# Patient Record
Sex: Male | Born: 1960
Health system: Southern US, Community
[De-identification: ages and names within clinical notes are randomized; demographics above are authoritative.]

## PROBLEM LIST (undated history)

## (undated) DIAGNOSIS — E785 Hyperlipidemia, unspecified: Secondary | ICD-10-CM

## (undated) DIAGNOSIS — G629 Polyneuropathy, unspecified: Secondary | ICD-10-CM

## (undated) DIAGNOSIS — R7309 Other abnormal glucose: Secondary | ICD-10-CM

## (undated) DIAGNOSIS — I10 Essential (primary) hypertension: Secondary | ICD-10-CM

## (undated) DIAGNOSIS — K635 Polyp of colon: Secondary | ICD-10-CM

## (undated) DIAGNOSIS — R030 Elevated blood-pressure reading, without diagnosis of hypertension: Secondary | ICD-10-CM

## (undated) DIAGNOSIS — R7989 Other specified abnormal findings of blood chemistry: Secondary | ICD-10-CM

## (undated) DIAGNOSIS — R0989 Other specified symptoms and signs involving the circulatory and respiratory systems: Secondary | ICD-10-CM

## (undated) DIAGNOSIS — N200 Calculus of kidney: Secondary | ICD-10-CM

## (undated) DIAGNOSIS — H269 Unspecified cataract: Secondary | ICD-10-CM

## (undated) DIAGNOSIS — L309 Dermatitis, unspecified: Secondary | ICD-10-CM

## (undated) DIAGNOSIS — R079 Chest pain, unspecified: Secondary | ICD-10-CM

## (undated) DIAGNOSIS — E119 Type 2 diabetes mellitus without complications: Secondary | ICD-10-CM

## (undated) DIAGNOSIS — U071 COVID-19: Secondary | ICD-10-CM

## (undated) DIAGNOSIS — G608 Other hereditary and idiopathic neuropathies: Secondary | ICD-10-CM

## (undated) DIAGNOSIS — K219 Gastro-esophageal reflux disease without esophagitis: Secondary | ICD-10-CM

## (undated) DIAGNOSIS — R609 Edema, unspecified: Secondary | ICD-10-CM

## (undated) DIAGNOSIS — R7301 Impaired fasting glucose: Secondary | ICD-10-CM

## (undated) HISTORY — DX: Polyneuropathy, unspecified: G62.9

## (undated) HISTORY — DX: Gastro-esophageal reflux disease without esophagitis: K21.9

## (undated) HISTORY — DX: Hyperlipidemia, unspecified: E78.5

## (undated) HISTORY — DX: Unspecified cataract: H26.9

## (undated) HISTORY — DX: Other specified symptoms and signs involving the circulatory and respiratory systems: R09.89

## (undated) HISTORY — DX: Type 2 diabetes mellitus without complications: E11.9

## (undated) HISTORY — DX: Other hereditary and idiopathic neuropathies: G60.8

## (undated) HISTORY — DX: Calculus of kidney: N20.0

## (undated) HISTORY — PX: KIDNEY STONE SURGERY: SHX686

## (undated) HISTORY — DX: Other abnormal glucose: R73.09

## (undated) HISTORY — DX: Elevated blood-pressure reading, without diagnosis of hypertension: R03.0

## (undated) HISTORY — DX: Edema, unspecified: R60.9

## (undated) HISTORY — DX: Other specified abnormal findings of blood chemistry: R79.89

## (undated) HISTORY — DX: Dermatitis, unspecified: L30.9

## (undated) HISTORY — DX: Impaired fasting glucose: R73.01

## (undated) HISTORY — DX: Polyp of colon: K63.5

## (undated) HISTORY — DX: COVID-19: U07.1

## (undated) HISTORY — DX: Chest pain, unspecified: R07.9

---

## 1996-01-12 DIAGNOSIS — N200 Calculus of kidney: Secondary | ICD-10-CM

## 1996-01-12 HISTORY — DX: Calculus of kidney: N20.0

## 2004-03-30 ENCOUNTER — Encounter: Admission: RE | Admit: 2004-03-30 | Discharge: 2004-03-30 | Payer: Self-pay | Admitting: Family Medicine

## 2006-01-11 DIAGNOSIS — G608 Other hereditary and idiopathic neuropathies: Secondary | ICD-10-CM

## 2006-01-11 HISTORY — DX: Other hereditary and idiopathic neuropathies: G60.8

## 2006-12-03 IMAGING — CT CT NECK W/O CM
2 series · 10 of 14 positions shown, 12 images · non-contrast
Comparison: none

[Series 2: without · axial · non-contrast · 0.57mm/px · z∈[+250,+400]mm · 3 of 61 slices shown]
[im 16/61  bone]
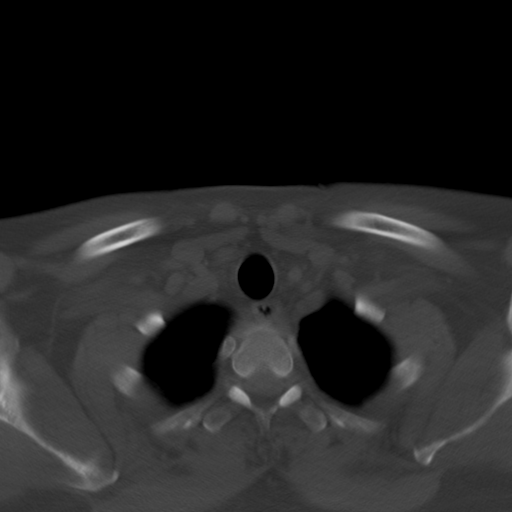
[im 31/61  bone]
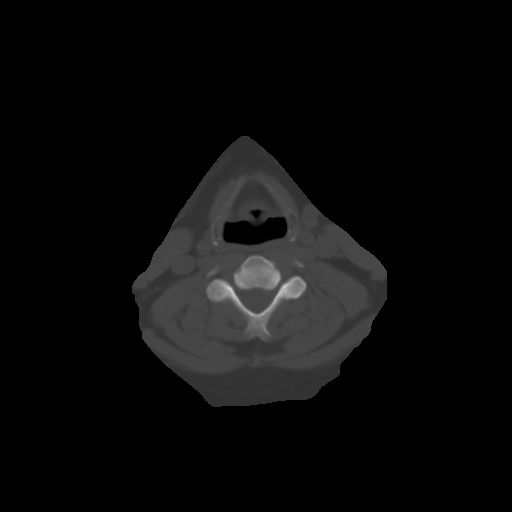
[im 46/61  bone]
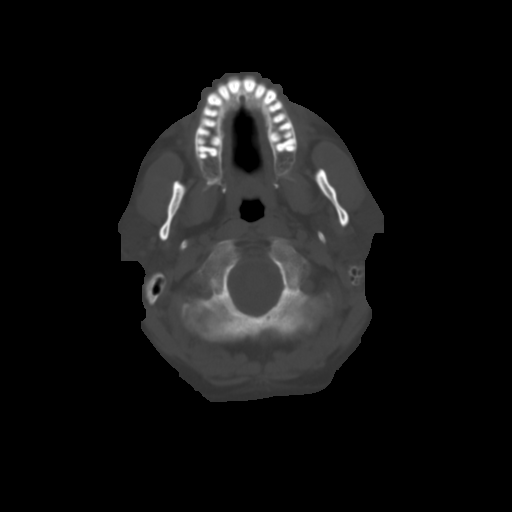

[Series 3: with contrast · axial · 0.58mm/px · z∈[+210,+435]mm · 7 of 101 slices shown, 9 images]
[im 13/101  soft-tissue]
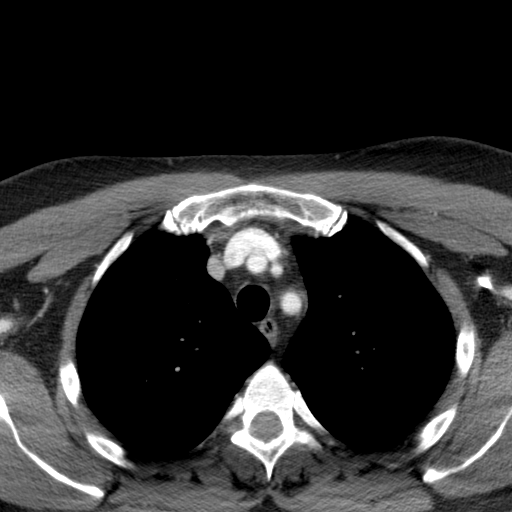
[im 13/101  bone]
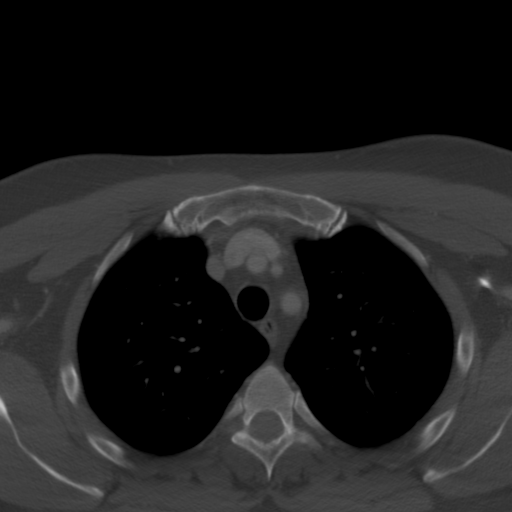
[im 26/101  bone]
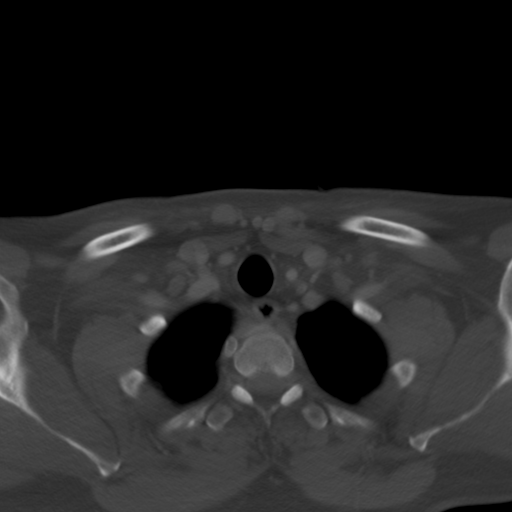
[im 38/101  bone]
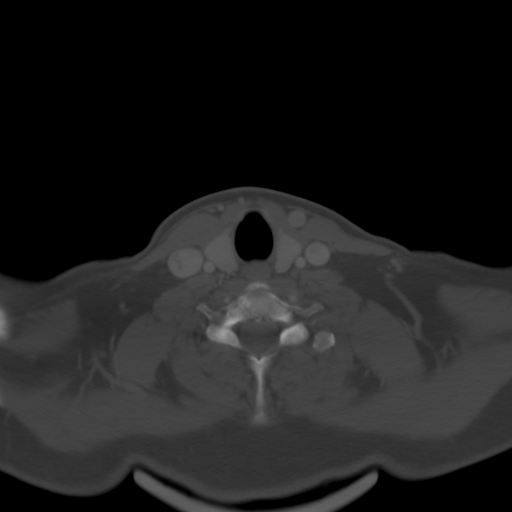
[im 51/101  bone]
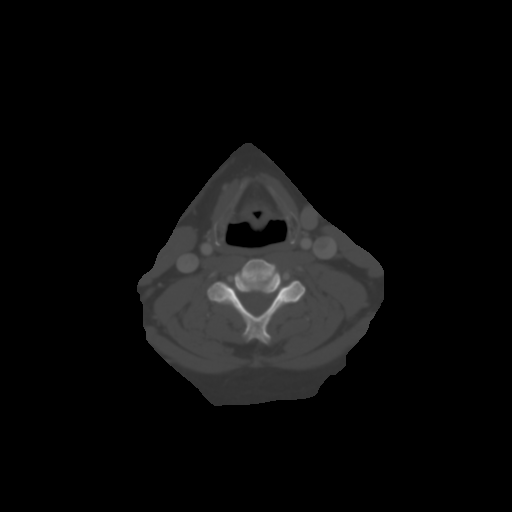
[im 63/101  soft-tissue]
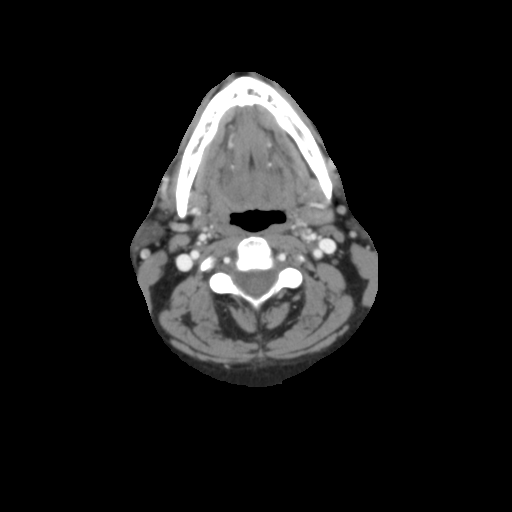
[im 63/101  bone]
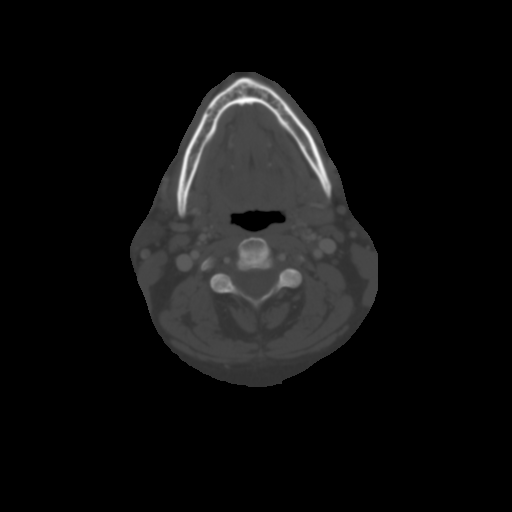
[im 76/101  bone]
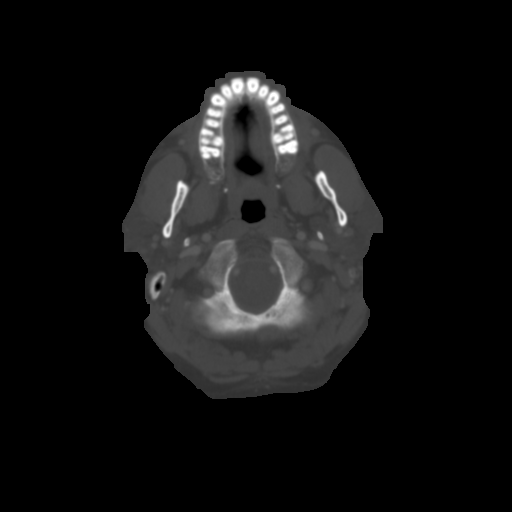
[im 88/101  bone]
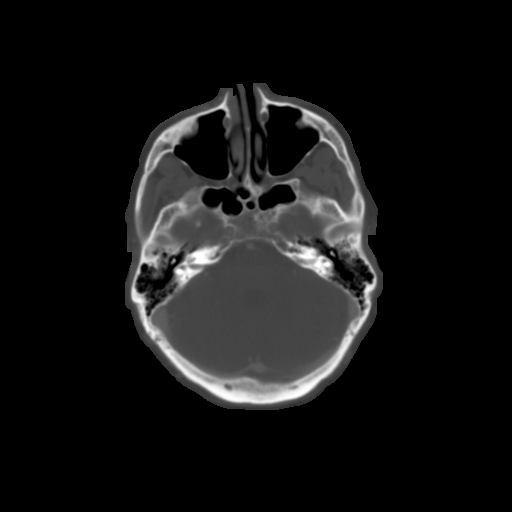

[10 of 14 positions shown; findings below may reference images not displayed]

Canned report from images found in remote index.

Refer to host system for actual result text.

## 2013-02-21 ENCOUNTER — Ambulatory Visit (HOSPITAL_COMMUNITY): Payer: BC Managed Care – PPO | Attending: Cardiology

## 2013-02-21 ENCOUNTER — Encounter (INDEPENDENT_AMBULATORY_CARE_PROVIDER_SITE_OTHER): Payer: Self-pay

## 2013-02-21 ENCOUNTER — Encounter: Payer: Self-pay | Admitting: Cardiology

## 2013-02-21 ENCOUNTER — Ambulatory Visit (INDEPENDENT_AMBULATORY_CARE_PROVIDER_SITE_OTHER): Payer: BC Managed Care – PPO | Admitting: Cardiology

## 2013-02-21 VITALS — BP 138/76 | HR 77 | Ht 75.0 in | Wt 271.0 lb

## 2013-02-21 DIAGNOSIS — G609 Hereditary and idiopathic neuropathy, unspecified: Secondary | ICD-10-CM

## 2013-02-21 DIAGNOSIS — R209 Unspecified disturbances of skin sensation: Secondary | ICD-10-CM | POA: Insufficient documentation

## 2013-02-21 DIAGNOSIS — G629 Polyneuropathy, unspecified: Secondary | ICD-10-CM

## 2013-02-21 DIAGNOSIS — E1342 Other specified diabetes mellitus with diabetic polyneuropathy: Secondary | ICD-10-CM

## 2013-02-21 DIAGNOSIS — E1349 Other specified diabetes mellitus with other diabetic neurological complication: Secondary | ICD-10-CM

## 2013-02-21 DIAGNOSIS — E785 Hyperlipidemia, unspecified: Secondary | ICD-10-CM

## 2013-02-21 DIAGNOSIS — I739 Peripheral vascular disease, unspecified: Secondary | ICD-10-CM

## 2013-02-21 DIAGNOSIS — R609 Edema, unspecified: Secondary | ICD-10-CM

## 2013-02-21 DIAGNOSIS — R7309 Other abnormal glucose: Secondary | ICD-10-CM | POA: Insufficient documentation

## 2013-02-21 DIAGNOSIS — R600 Localized edema: Secondary | ICD-10-CM

## 2013-02-21 DIAGNOSIS — I10 Essential (primary) hypertension: Secondary | ICD-10-CM | POA: Insufficient documentation

## 2013-02-21 DIAGNOSIS — E1142 Type 2 diabetes mellitus with diabetic polyneuropathy: Secondary | ICD-10-CM

## 2013-02-21 DIAGNOSIS — R0789 Other chest pain: Secondary | ICD-10-CM

## 2013-02-21 DIAGNOSIS — R0989 Other specified symptoms and signs involving the circulatory and respiratory systems: Secondary | ICD-10-CM | POA: Insufficient documentation

## 2013-02-21 NOTE — Patient Instructions (Signed)
Your physician recommends that you continue on your current medications as directed. Please refer to the Current Medication list given to you today.  Your physician has requested that you have an echocardiogram. Echocardiography is a painless test that uses sound waves to create images of your heart. It provides your doctor with information about the size and shape of your heart and how well your heart's chambers and valves are working. This procedure takes approximately one hour. There are no restrictions for this procedure.  Your physician has requested that you have en exercise stress myoview. For further information please visit [email or web address]. Please follow instruction sheet, as given.  Your physician has requested that you have a lower extremity arterial duplex. This test is an ultrasound of the arteries in the legs or arms. It looks at arterial blood flow in the legs and arms. Allow one hour for Lower and Upper Arterial scans. There are no restrictions or special instructions  Your physician recommends that you follow-up as needed.

## 2013-02-21 NOTE — Progress Notes (Signed)
[place]. [address], [address] [place], [place]  [address] Phone: [phone] Fax:  [phone]  Date:  02/21/2013   ID:  Romen, Stephen Dorsey, MRN [id number]  PCP:  Gerrit Heck, MD   History of Present Illness: Stephen Dorsey is a 53 y.o. male here for the evaluation of decreased pedal pulses and possible peripheral vascular disease.  Been experiencing bilateral lower extremity edema to mid calf with apparent decreased pulses on the right. A few years ago dx with peripheral neuropathy. Spots on feet. Worried about poor circulation. Some chest soreness epigastric after eating, spicy. Some soreness in muscles in chest wall. Surface.  Lasix 20 mg was started as needed for edema but he has not taken. Fasting glucose was 106, hemoglobin 15.6, hemoglobin A1c 5.7, potassium 4.2, creatinine 0.93. His LDL cholesterol was 120. TSH 3.92. Pre diabetic, on metformin.    Wt Readings from Last 3 Encounters:  02/21/13 271 lb (122.925 kg)     Past Medical History  Diagnosis Date  . Peripheral neuropathy   . Elevated fasting glucose   . Kidney stone 1998  . Idiopathic small fiber sensory neuropathy 2008    Dx in 2008. Getting worse, has trouble feeling his feet. No Sx's in arms.  . Colon polyps     By colonoscopy in 2009   . Chest pain     Occasional CP  . Decreased pedal pulses   . Edema   . Elevated blood pressure reading without diagnosis of hypertension     History reviewed. No pertinent past surgical history.  Current Outpatient Prescriptions  Medication Sig Dispense Refill  . metFORMIN (GLUCOPHAGE) 500 MG tablet Take 500 mg by mouth 2 (two) times daily with a meal.      . Multiple Vitamin (MULTIVITAMIN) tablet Take as directed      . Probiotic Product (PROBIOTIC DAILY PO) Take 1 capsule by mouth as needed.      . Psyllium (METAMUCIL) 30.9 % POWD Take 1 packet by mouth daily.       No current facility-administered medications for this visit.    Allergies:    No Known Allergies  Social History:  The patient  reports that he has never smoked. He does not have any smokeless tobacco history on file. He reports that he drinks about 1.2 ounces of alcohol per week. He reports that he does not use illicit drugs. travels frequently with company  History reviewed. No pertinent family history. No early CAD. Mother thyroid. Father lung CA.   ROS:  Please see the history of present illness.   Denies any syncope, bleeding, orthopnea, PND   All other systems reviewed and negative.   PHYSICAL EXAM: VS:  BP 138/76  Pulse 77  Ht 6\' 3"  (1.905 m)  Wt 271 lb (122.925 kg)  BMI 33.87 kg/m2 Well nourished, well developed, in no acute distress HEENT: normal, [place]/AT, EOMI Neck: no JVD, normal carotid upstroke, no bruit Cardiac:  normal S1, S2; RRR; no murmurNo obvious JVD Lungs:  clear to auscultation bilaterally, no wheezing, rhonchi or rales Abd: soft, nontender, no hepatomegaly, no bruits Ext: 2+  edema, diminished distal pulses, difficult to palpate, decreased sensation in feet bilaterally. Skin: warm and dry, shiny appearance to feet bilaterally GU: deferred Neuro: no focal abnormalities noted, AAO x 3  EKG:  Sinus rhythm rate 77 with no other abnormalities     ASSESSMENT AND PLAN:  1. Diminished pulses, pedal, posterior tibial-concerned about peripheral  vascular disease. I will check lower extremity ultrasound/ABI. Has the appearance of decreased pulses on feet bilaterally. He is seeing neurology on Monday. Previously he states that he has had vitamin B12 evaluated. I'm concerned about the extent of his neuropathy. 2. Atypical chest pain-likely GERD/musculoskeletal related however given his diabetes and possible peripheral vascular disease, I will check a nuclear stress test. 3. Hyperlipidemia-LDL currently 120. My suggestion would be to start statin therapy especially with his diabetes. It would be nice to see his LDL goal less than 100, less than 70 would  be outstanding. 4. Edema-could be chronic venous insufficiency, could be secondary to elevated central venous pressures for right-sided heart issue. I will check echocardiogram. Encouraged him to utilize Lasix, compression stockings. 5. Diabetes-hemoglobin A1c 5.7 on metformin, seems to be under good control however he does have significant peripheral neuropathy which may be unrelated. Questionable peripheral vascular disease. 6. We will followup with testing  Signed, Candee Furbish, MD [facility]  02/21/2013 10:51 AM

## 2013-02-26 ENCOUNTER — Encounter: Payer: Self-pay | Admitting: Diagnostic Neuroimaging

## 2013-02-26 ENCOUNTER — Ambulatory Visit (INDEPENDENT_AMBULATORY_CARE_PROVIDER_SITE_OTHER): Payer: BC Managed Care – PPO | Admitting: Diagnostic Neuroimaging

## 2013-02-26 VITALS — BP 196/102 | HR 71 | Temp 98.1°F | Ht 74.0 in | Wt 270.0 lb

## 2013-02-26 DIAGNOSIS — G609 Hereditary and idiopathic neuropathy, unspecified: Secondary | ICD-10-CM

## 2013-02-26 NOTE — Progress Notes (Signed)
[facility]  PATIENT: Stephen Dorsey DOB: 14-Sep-1960  REFERRING CLINICIAN: Drema Dallas  HISTORY FROM: patient REASON FOR VISIT: new consult   HISTORICAL  CHIEF COMPLAINT:  Chief Complaint  Patient presents with  . Peripheral Neuropathy    numbness in toes, bilateral    HISTORY OF PRESENT ILLNESS:   53 year old right-handed male with hypertension, elevated blood sugars, here for evaluation of neuropathy.  2006 patient developed onset of numbness, burning sensation in his feet. Symptoms mainly on the top of his feet and his toes. He also developing numbness and painful sensation. Over time this gradually progressed up to his knees. Currently he has no pain. Patient had EMG nerve conduction study in 2008 which was normal. Patient was diagnosed with possible idiopathic small fiber neuropathy. Patient's father had similar symptoms.  Patient now resides for reevaluation to see if there is any specific cause. No symptoms in his hands or fingers. He is having some balance issues.  Patient also developed edema in his lower extremities recently. He had decreased pedal pulses, was then referred to cardiology for evaluation of peripheral vascular disease. Lower extremity ankle-brachial index and ultrasound testing is normal.  REVIEW OF SYSTEMS: Full 14 system review of systems performed and notable only for decreased sleep numbness snoring swelling in legs itching.  ALLERGIES: No Known Allergies  HOME MEDICATIONS: Outpatient Prescriptions Prior to Visit  Medication Sig Dispense Refill  . metFORMIN (GLUCOPHAGE) 500 MG tablet Take 500 mg by mouth 2 (two) times daily with a meal.      . Multiple Vitamin (MULTIVITAMIN) tablet Take as directed      . Probiotic Product (PROBIOTIC DAILY PO) Take 1 capsule by mouth as needed.      . Psyllium (METAMUCIL) 30.9 % POWD Take 1 packet by mouth daily.       No facility-administered medications prior to visit.    PAST MEDICAL  HISTORY: Past Medical History  Diagnosis Date  . Peripheral neuropathy   . Elevated fasting glucose   . Kidney stone 1998  . Idiopathic small fiber sensory neuropathy 2008    Dx in 2008. Getting worse, has trouble feeling his feet. No Sx's in arms.  . Colon polyps     By colonoscopy in 2009   . Chest pain     Occasional CP  . Decreased pedal pulses   . Edema   . Elevated blood pressure reading without diagnosis of hypertension   . Dermatitis   . Elevated glucose     PAST SURGICAL HISTORY: Past Surgical History  Procedure Laterality Date  . Kidney stone surgery      FAMILY HISTORY: Family History  Problem Relation Age of Onset  . Thyroid disease Mother     thyroid problems  . Lung cancer Father   . Diabetes Maternal Grandfather     SOCIAL HISTORY:  History   Social History  . Marital Status: Married    Spouse Name: [name]    Number of Children: 2  . Years of Education: College   Occupational History  .      [employer]   Social History Main Topics  . Smoking status: Never Smoker   . Smokeless tobacco: Never Used  . Alcohol Use: 1.2 oz/week    2 Cans of beer per week     Comment: 2+ beers per week  . Drug Use: No  . Sexual Activity: Not on file   Other Topics Concern  . Not on file   Social History  Narrative   Patient lives at home with family.   Caffeine Use: 200z daily     PHYSICAL EXAM  Filed Vitals:   02/26/13 0901  BP: 196/102  Pulse: 71  Temp: 98.1 F (36.7 C)  TempSrc: Oral  Height: 6\' 2"  (1.88 m)  Weight: 270 lb (122.471 kg)    Not recorded    Body mass index is 34.65 kg/(m^2).  GENERAL EXAM: Patient is in no distress; well developed, nourished and groomed; neck is supple  CARDIOVASCULAR: Regular rate and rhythm, no murmurs, no carotid bruits; DECR DORSALIS PEDIS PULSES.  NEUROLOGIC: MENTAL STATUS: awake, alert, oriented to person, place and time, recent and remote memory intact, normal attention and concentration, language  fluent, comprehension intact, naming intact, fund of knowledge appropriate CRANIAL NERVE: no papilledema on fundoscopic exam, pupils equal and reactive to light, visual fields full to confrontation, extraocular muscles intact, no nystagmus, facial sensation and strength symmetric, hearing intact, palate elevates symmetrically, uvula midline, shoulder shrug symmetric, tongue midline. MOTOR: normal bulk and tone, full strength in the BUE, BLE SENSORY: normal and symmetric to proprioception; DECR PP, TEMP, VIB IN TOES, IN GRADIENT UP TO KNEES. COORDINATION: finger-nose-finger, fine finger movements normal REFLEXES: deep tendon reflexes present and symmetric; KNEES 3, RIGHT ANKLE 2, LEFT ANKLE 1; DOWN GOING TOES. GAIT/STATION: narrow based gait; able to walk on toes, heels and tandem; romberg is negative    DIAGNOSTIC DATA (LABS, IMAGING, TESTING) - I reviewed patient records, labs, notes, testing and imaging myself where available.  No results found for this basename: WBC, HGB, HCT, MCV, PLT   No results found for this basename: na, k, cl, co2, glucose, bun, creatinine, calcium, prot, albumin, ast, alt, alkphos, bilitot, gfrnonaa, gfraa   No results found for this basename: CHOL, HDL, LDLCALC, LDLDIRECT, TRIG, CHOLHDL   No results found for this basename: HGBA1C   No results found for this basename: VITAMINB12   No results found for this basename: TSH    12/27/06 EMG/NCS - normal  02/22/13 BLE arterial u/s and ABI - normal  Labs - B12 424, TSH 3.92, A1c 5.7, fasting glucose 106  Lipids - CHOL 178, TRIG 80, LDL 120, HDL 42  ASSESSMENT AND PLAN  53 y.o. year old male here with diopathic small fiber neuropathy since 2006. Positive family history of neuropathy in father.  PLAN: - repeat EMG/NCS - consider additional lab screening for neuropathy causes - continue follow up of HTN, lipids and glucose per PCP   Orders Placed This Encounter  Procedures  . NCV with  EMG(electromyography)   Return for emg.    Penni Bombard, MD 04/13/4740, 59:56 AM Certified in Neurology, Neurophysiology and Neuroimaging  [facility] [address] [place],  [address] [phone]

## 2013-02-28 ENCOUNTER — Ambulatory Visit (INDEPENDENT_AMBULATORY_CARE_PROVIDER_SITE_OTHER): Payer: BC Managed Care – PPO | Admitting: Diagnostic Neuroimaging

## 2013-02-28 ENCOUNTER — Telehealth (HOSPITAL_COMMUNITY): Payer: Self-pay | Admitting: *Deleted

## 2013-02-28 ENCOUNTER — Encounter (INDEPENDENT_AMBULATORY_CARE_PROVIDER_SITE_OTHER): Payer: Self-pay

## 2013-02-28 DIAGNOSIS — G609 Hereditary and idiopathic neuropathy, unspecified: Secondary | ICD-10-CM

## 2013-02-28 DIAGNOSIS — Z0289 Encounter for other administrative examinations: Secondary | ICD-10-CM

## 2013-02-28 NOTE — Procedures (Signed)
   [facility]  NCS (NERVE CONDUCTION STUDY) WITH EMG (ELECTROMYOGRAPHY) REPORT   STUDY DATE: 02/28/13 PATIENT NAME: Stephen Dorsey DOB: 06/13/1960 MRN: [id number]  ORDERING CLINICIAN: Andrey Spearman, MD   TECHNOLOGIST: Laretta Alstrom ELECTROMYOGRAPHER: Earlean Polka. Penumalli, MD  CLINICAL INFORMATION: 53 year old male with numbness.  FINDINGS: NERVE CONDUCTION STUDY: Right median motor response has normal distal latency, amplitude, conduction velocity and F-wave latency. Left peroneal motor response has normal distal latency, amplitude, conduction velocity and F-wave latency. Right peroneal motor response has normal distal latency, decreased amplitude, normal conduction velocity and prolonged F-wave latency. Right tibial motor response has prolonged distal latency, normal amplitude, normal conduction velocity and prolonged F-wave latency. Left tibial motor response has prolonged distal latency, decreased amplitude, normal conduction velocity and prolonged F-wave latency. Bilateral H reflex responses could not be obtained. Right median sensory response is normal. Bilateral sural, peroneal, medial and lateral plantar sensory responses could not be obtained.   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 Concomitant lumbar polyradiculopathy not totally excluded, based on F-wave and H-reflex abnormalities.   INTERPRETING PHYSICIAN:  Penni Bombard, MD Certified in Neurology, Neurophysiology and Neuroimaging  [facility] [address] [place], [place] [address] [phone]

## 2013-03-01 ENCOUNTER — Ambulatory Visit (HOSPITAL_COMMUNITY): Payer: BC Managed Care – PPO

## 2013-03-02 ENCOUNTER — Encounter (HOSPITAL_COMMUNITY): Payer: BC Managed Care – PPO

## 2013-03-07 ENCOUNTER — Encounter (HOSPITAL_COMMUNITY): Payer: BC Managed Care – PPO

## 2013-03-07 ENCOUNTER — Telehealth (HOSPITAL_COMMUNITY): Payer: Self-pay | Admitting: *Deleted

## 2013-03-08 ENCOUNTER — Ambulatory Visit (HOSPITAL_COMMUNITY): Payer: BC Managed Care – PPO

## 2013-03-08 LAB — LYME, TOTAL AB TEST/REFLEX

## 2013-03-08 LAB — NEUROPATHY PANEL
A/G Ratio: 1.9 (ref 0.7–2.0)
ALPHA 1: 0.1 g/dL (ref 0.1–0.4)
ALPHA 2: 0.5 g/dL (ref 0.4–1.2)
Albumin ELP: 4.3 g/dL (ref 3.2–5.6)
Angio Convert Enzyme: 32 U/L (ref 14–82)
Anti Nuclear Antibody(ANA): NEGATIVE
BETA: 0.8 g/dL (ref 0.6–1.3)
GAMMA GLOBULIN: 0.8 g/dL (ref 0.5–1.6)
GLOBULIN, TOTAL: 2.3 g/dL (ref 2.0–4.5)
SED RATE: 5 mm/h (ref 0–30)
TSH: 4.27 u[IU]/mL (ref 0.450–4.500)
Total Protein: 6.6 g/dL (ref 6.0–8.5)
Vit D, 25-Hydroxy: 25.5 ng/mL — ABNORMAL LOW (ref 30.0–100.0)
Vitamin B-12: 692 pg/mL (ref 211–946)

## 2013-03-08 LAB — HIV ANTIBODY (ROUTINE TESTING W REFLEX): HIV-1/HIV-2 Ab: NONREACTIVE

## 2013-03-08 LAB — HEPATITIS B SURFACE ANTIGEN: HEP B S AG: NEGATIVE

## 2013-03-08 LAB — HEPATITIS B CORE ANTIBODY, TOTAL: Hep B Core Total Ab: NEGATIVE

## 2013-03-08 LAB — HEPATITIS C ANTIBODY: Hep C Virus Ab: <0.1 s/co ratio (ref 0.0–0.9)

## 2013-03-08 LAB — HEPATITIS B SURFACE ANTIBODY,QUALITATIVE: HEP B SURFACE AB, QUAL: NONREACTIVE

## 2013-03-20 ENCOUNTER — Ambulatory Visit (HOSPITAL_COMMUNITY)
Admission: RE | Admit: 2013-03-20 | Discharge: 2013-03-20 | Disposition: A | Discharge status: Home / Self Care | Payer: BC Managed Care – PPO | Source: Ambulatory Visit | Attending: Cardiovascular Disease | Admitting: Cardiovascular Disease

## 2013-03-20 DIAGNOSIS — I517 Cardiomegaly: Secondary | ICD-10-CM

## 2013-03-20 DIAGNOSIS — R0789 Other chest pain: Secondary | ICD-10-CM | POA: Insufficient documentation

## 2013-03-20 NOTE — Progress Notes (Signed)
2D Echo Performed 03/20/2013    Ambika Zettlemoyer, RCS  

## 2013-03-21 ENCOUNTER — Telehealth (HOSPITAL_COMMUNITY): Payer: Self-pay

## 2013-03-23 ENCOUNTER — Encounter (HOSPITAL_COMMUNITY): Payer: BC Managed Care – PPO

## 2013-04-12 ENCOUNTER — Telehealth (HOSPITAL_COMMUNITY): Payer: Self-pay

## 2013-04-18 ENCOUNTER — Ambulatory Visit (HOSPITAL_COMMUNITY)
Admission: RE | Admit: 2013-04-18 | Discharge: 2013-04-18 | Disposition: A | Discharge status: Home / Self Care | Payer: BC Managed Care – PPO | Source: Ambulatory Visit | Attending: Internal Medicine | Admitting: Internal Medicine

## 2013-04-18 DIAGNOSIS — E663 Overweight: Secondary | ICD-10-CM | POA: Insufficient documentation

## 2013-04-18 DIAGNOSIS — I739 Peripheral vascular disease, unspecified: Secondary | ICD-10-CM | POA: Insufficient documentation

## 2013-04-18 DIAGNOSIS — I10 Essential (primary) hypertension: Secondary | ICD-10-CM | POA: Insufficient documentation

## 2013-04-18 DIAGNOSIS — I959 Hypotension, unspecified: Secondary | ICD-10-CM | POA: Insufficient documentation

## 2013-04-18 DIAGNOSIS — R079 Chest pain, unspecified: Secondary | ICD-10-CM | POA: Insufficient documentation

## 2013-04-18 DIAGNOSIS — I999 Unspecified disorder of circulatory system: Secondary | ICD-10-CM | POA: Insufficient documentation

## 2013-04-18 DIAGNOSIS — R002 Palpitations: Secondary | ICD-10-CM | POA: Insufficient documentation

## 2013-04-18 DIAGNOSIS — R0789 Other chest pain: Secondary | ICD-10-CM

## 2013-04-18 DIAGNOSIS — R0609 Other forms of dyspnea: Secondary | ICD-10-CM | POA: Insufficient documentation

## 2013-04-18 DIAGNOSIS — R0989 Other specified symptoms and signs involving the circulatory and respiratory systems: Secondary | ICD-10-CM | POA: Insufficient documentation

## 2013-04-18 DIAGNOSIS — R5381 Other malaise: Secondary | ICD-10-CM | POA: Insufficient documentation

## 2013-04-18 DIAGNOSIS — R5383 Other fatigue: Secondary | ICD-10-CM

## 2013-04-18 DIAGNOSIS — I4949 Other premature depolarization: Secondary | ICD-10-CM | POA: Insufficient documentation

## 2013-04-18 MED ORDER — TECHNETIUM TC 99M SESTAMIBI GENERIC - CARDIOLITE
30.8000 | Freq: Once | INTRAVENOUS | Status: AC | PRN
  Administered 2013-04-18: 30.8 via INTRAVENOUS

## 2013-04-18 MED ORDER — TECHNETIUM TC 99M SESTAMIBI GENERIC - CARDIOLITE
10.5000 | Freq: Once | INTRAVENOUS | Status: AC | PRN
  Administered 2013-04-18: 11 via INTRAVENOUS

## 2013-04-18 NOTE — Procedures (Addendum)
[facility] [address] [place] [address] [phone]  Cardiology Nuclear Med Study  Stephen Dorsey is a 53 y.o. male     MRN : [id number]     DOB: 01-30-1960  Procedure Date: 04/18/2013  Nuclear Med Background Indication for Stress Test:  Evaluation for Ischemia History:  No prior cardiac or respiratory history reported. No prior NUC study for comparrison. Cardiac Risk Factors: Hypertension, Overweight and PVD  Symptoms:  Chest Pain, DOE, Fatigue and Palpitations   Nuclear Pre-Procedure Caffeine/Decaff Intake:  7:00pm NPO After: 5:00am   IV Site: R Forearm  IV 0.9% NS with Angio Cath:  22g  Chest Size (in):  44"  IV Started by: Azucena Cecil, RN  Height: 6\' 3"  (1.905 m)  Cup Size: n/a  BMI:  Body mass index is 33.87 kg/(m^2). Weight:  271 lb (122.925 kg)   Tech Comments:  n/a    Nuclear Med Study 1 or 2 day study: 1 day  Stress Test Type:  Stress  Order Authorizing Provider:  Candee Furbish, MD   Resting Radionuclide: Technetium 58m Sestamibi  Resting Radionuclide Dose: 10.5 mCi   Stress Radionuclide:  Technetium 104m Sestamibi  Stress Radionuclide Dose: 30.8 mCi           Stress Protocol Rest HR: 74 Stress HR: 157  Rest BP: 165/105 Stress BP: 197/99  Exercise Time (min): 8:36 METS: 10.1   Predicted Max HR: 166 bpm % Max HR: 94.58 bpm Rate Pressure Product: 32656  Dose of Adenosine (mg):  n/a Dose of Lexiscan: n/a mg  Dose of Atropine (mg): n/a Dose of Dobutamine: n/a mcg/kg/min (at max HR)  Stress Test Technologist: Leane Para, CCT Nuclear Technologist: Imagene Riches, CNMT   Rest Procedure:  Myocardial perfusion imaging was performed at rest 45 minutes following the intravenous administration of Technetium 64m Sestamibi. Stress Procedure:  The patient performed treadmill exercise using a Bruce  Protocol for 8:36 minutes. The patient stopped due to SOB and Fatigue and denied any chest pain.  There were  no significant ST-T wave changes.  Technetium 53m Sestamibi was injected IV at peak exercise and myocardial perfusion imaging was performed after a brief delay.  Transient Ischemic Dilatation (Normal <1.22):  0.76 Lung/Heart Ratio (Normal <0.45):  0.30 QGS EDV:  107 ml QGS ESV:  40 ml LV Ejection Fraction: 63%  Rest ECG: NSR - Normal EKG  Stress ECG: No significant ST segment change suggestive of ischemia.  QPS Raw Data Images:  Normal; no motion artifact; normal heart/lung ratio. Stress Images:  There is decreased uptake in the inferior wall. Rest Images:  There is decreased uptake in the inferior wall. Subtraction (SDS):  Partially reversible basal inferoseptal defect  Impression Exercise Capacity:  Good exercise capacity. BP Response:  Hypotensive blood pressure response. Clinical Symptoms:  There is dyspnea. ECG Impression:  There are scattered PVCs. Comparison with Prior Nuclear Study: No previous nuclear study performed  Overall Impression:  Intermediate risk stress nuclear study with moderate-sized area of partially reversible inferoseptal ischemia (SDS 5, Extent 7%) There was a hypotensive response to exercise. PVC's were noted, but no diagnostic ST segment depression was appreciated.  LV Wall Motion:  EF 63%, basal inferoseptal hypokinesis.  Pixie Casino, MD, [facility] Board Certified in Nuclear Cardiology Attending Cardiologist [place]  Pixie Casino, MD  04/18/2013 12:55 PM

## 2013-04-26 ENCOUNTER — Encounter: Payer: Self-pay | Admitting: Cardiology

## 2013-05-03 ENCOUNTER — Encounter: Payer: Self-pay | Admitting: Cardiology

## 2013-05-09 NOTE — Telephone Encounter (Signed)
Follow up    Pt would like a reply to his questions on MyChart today, so that he can come in with some educated questions to his 4/30 visit please.  Thanks!

## 2013-05-10 ENCOUNTER — Encounter: Payer: Self-pay | Admitting: Cardiology

## 2013-05-10 ENCOUNTER — Ambulatory Visit (INDEPENDENT_AMBULATORY_CARE_PROVIDER_SITE_OTHER): Payer: BC Managed Care – PPO | Admitting: Cardiology

## 2013-05-10 VITALS — BP 148/86 | HR 78 | Ht 74.0 in | Wt 268.0 lb

## 2013-05-10 DIAGNOSIS — R079 Chest pain, unspecified: Secondary | ICD-10-CM

## 2013-05-10 DIAGNOSIS — E785 Hyperlipidemia, unspecified: Secondary | ICD-10-CM | POA: Insufficient documentation

## 2013-05-10 DIAGNOSIS — E1169 Type 2 diabetes mellitus with other specified complication: Secondary | ICD-10-CM | POA: Insufficient documentation

## 2013-05-10 DIAGNOSIS — R9439 Abnormal result of other cardiovascular function study: Secondary | ICD-10-CM | POA: Insufficient documentation

## 2013-05-10 DIAGNOSIS — R609 Edema, unspecified: Secondary | ICD-10-CM | POA: Insufficient documentation

## 2013-05-10 DIAGNOSIS — E119 Type 2 diabetes mellitus without complications: Secondary | ICD-10-CM | POA: Insufficient documentation

## 2013-05-10 NOTE — Patient Instructions (Signed)
Your physician recommends that you continue on your current medications as directed. Please refer to the Current Medication list given to you today.  Your physician wants you to follow-up in: 1 year with Dr. Skains. You will receive a reminder letter in the mail two months in advance. If you don't receive a letter, please call our office to schedule the follow-up appointment.  

## 2013-05-10 NOTE — Progress Notes (Signed)
Stephen Dorsey. [address] [place], [place]  [address] Phone: [phone] Fax:  [phone]  Date:  05/10/2013   ID:  Stephen Dorsey, Stephen Dorsey [birth year], MRN [id number]  PCP:  Stephen Heck, MD   History of Present Illness: Stephen Dorsey is a 53 y.o. male here for the follow up of decreased pedal pulses and possible peripheral vascular disease.  Been experiencing bilateral lower extremity edema to mid calf with apparent decreased pulses on the right. A few years ago dx with peripheral neuropathy. Spots on feet. Worried about poor circulation. Some chest soreness epigastric after eating, spicy. Some soreness in muscles in chest wall. Surface.  Lasix 20 mg was started as needed for edema but he has not taken. Fasting glucose was 106, hemoglobin 15.6, hemoglobin A1c 5.7, potassium 4.2, creatinine 0.93. His LDL cholesterol was 120. TSH 3.92. Pre diabetic, on metformin.   ABI 02/22/13: Normal  ECHO 03/20/13: - Left ventricle: The cavity size was mildly dilated. Wall thickness was normal. Systolic function was normal. The estimated ejection fraction was in the range of 55% to 60%. Wall motion was normal; there were no regional wall motion abnormalities. Doppler parameters are consistent with abnormal left ventricular relaxation (grade 1 diastolic dysfunction). - Aortic valve: Trivial regurgitation. - Aortic root: The aortic root was mildly dilated. 41mm - Left atrium: The atrium was mildly dilated.  NUC STRESS: 04/18/13: Overall Impression: Intermediate risk stress nuclear study with  moderate-sized area of partially reversible inferoseptal ischemia (SDS 5, Extent 7%) There was a hypotensive response to exercise.  PVC's were noted, but no diagnostic ST segment depression was  appreciated.  LV Wall Motion: EF 63%, basal inferoseptal hypokinesis.   Encouraged to proceed with heart catheterization based on findings. Please see below.   Wt Readings from Last 3 Encounters:    05/10/13 268 lb (121.564 kg)  04/18/13 271 lb (122.925 kg)  02/26/13 270 lb (122.471 kg)     Past Medical History  Diagnosis Date  . Peripheral neuropathy   . Elevated fasting glucose   . Kidney stone 1998  . Idiopathic small fiber sensory neuropathy 2008    Dx in 2008. Getting worse, has trouble feeling his feet. No Sx's in arms.  . Colon polyps     By colonoscopy in 2009   . Chest pain     Occasional CP  . Decreased pedal pulses   . Edema   . Elevated blood pressure reading without diagnosis of hypertension   . Dermatitis   . Elevated glucose     Past Surgical History  Procedure Laterality Date  . Kidney stone surgery      No current outpatient prescriptions on file.   No current facility-administered medications for this visit.    Allergies:   No Known Allergies  Social History:  The patient  reports that he has never smoked. He has never used smokeless tobacco. He reports that he drinks about 1.2 ounces of alcohol per week. He reports that he does not use illicit drugs. travels frequently with company  Family History  Problem Relation Age of Onset  . Thyroid disease Mother     thyroid problems  . Lung cancer Father   . Diabetes Maternal Grandfather    No early CAD. Mother thyroid. Father lung CA.   ROS:  Please see the history of present illness.   Denies any syncope, bleeding, orthopnea, PND   All other systems reviewed and negative.   PHYSICAL EXAM:  VS:  BP 148/86  Pulse 78  Ht 6\' 2"  (1.88 m)  Wt 268 lb (121.564 kg)  BMI 34.39 kg/m2 Well nourished, well developed, in no acute distress(Parts of physical exam were performed during original consultative visit) HEENT: normal, [place]/AT, EOMI Neck: no JVD, normal carotid upstroke, no bruit Cardiac:  normal S1, S2; RRR; no murmurNo obvious JVD Lungs:  clear to auscultation bilaterally, no wheezing, rhonchi or rales Abd: soft, nontender, no hepatomegaly, no bruits Ext: 2+  edema, diminished distal pulses,  difficult to palpate, decreased sensation in feet bilaterally. Skin: warm and dry, shiny appearance to feet bilaterally GU: deferred Neuro: no focal abnormalities noted, AAO x 3  EKG:  Sinus rhythm rate 77 with no other abnormalities     ASSESSMENT AND PLAN:  1. Abnormal nuclear stress test-intermediate risk result with hypotension during exercise, possible inferoseptal wall ischemia in place. Recommend cardiac catheterization. He will continue to monitor symptoms. GERD like symptoms. If worsening occurs please contact me. Expressed concerns over study findings. He understands that this could result in possible myocardial infarction. Had several questions about nuclear stress test, and we went over in detail the physiology behind this study. Ultimately, with decreasing blood pressure reported and defect noted, I recommend proceeding with angiogram. For now, continue with lifestyle modifications. Dr. Drema Dorsey continuing to monitor him.  2. Diminished pulses, pedal, posterior tibial-overall normal ABIs. Has the appearance of decreased pulses on feet bilaterally. He is seeing neurology on Monday. Previously he states that he has had vitamin B12 evaluated. I'm concerned about the extent of his neuropathy. 3. Atypical chest pain-intermediate risk nuclear stress test, recommend further cardiac catheterization. At this time he does not wish to proceed "feels tested out".  4. Hyperlipidemia-LDL currently 120. My suggestion would be to start statin therapy especially with his diabetes. It would be nice to see his LDL goal less than 100, less than 70 would be outstanding. 5. Edema-could be chronic venous insufficiency. Right-sided heart appeared normal.  Encouraged him to utilize Lasix, compression stockings. Explained to him interstitial edema, hydrostatic pressures. 6. Diabetes-hemoglobin A1c 5.7 on metformin, seems to be under good control however he does have significant peripheral neuropathy which may be  unrelated.  7. Encouraged him to followup. 25 minutes spent with analysis of data and direct counseling with patient.  Signed, Stephen Furbish, MD [facility]  05/10/2013 3:33 PM

## 2013-07-12 NOTE — Telephone Encounter (Signed)
Encounter complete. 

## 2013-07-25 NOTE — Telephone Encounter (Signed)
Encounter complete. 

## 2013-10-26 ENCOUNTER — Other Ambulatory Visit: Payer: Self-pay

## 2014-07-08 ENCOUNTER — Other Ambulatory Visit: Payer: Self-pay

## 2015-08-14 LAB — HM COLONOSCOPY

## 2016-02-10 DIAGNOSIS — G629 Polyneuropathy, unspecified: Secondary | ICD-10-CM | POA: Diagnosis not present

## 2016-02-10 DIAGNOSIS — E7800 Pure hypercholesterolemia, unspecified: Secondary | ICD-10-CM | POA: Diagnosis not present

## 2016-02-10 DIAGNOSIS — E291 Testicular hypofunction: Secondary | ICD-10-CM | POA: Diagnosis not present

## 2016-02-10 DIAGNOSIS — R7303 Prediabetes: Secondary | ICD-10-CM | POA: Diagnosis not present

## 2016-02-10 DIAGNOSIS — I10 Essential (primary) hypertension: Secondary | ICD-10-CM | POA: Diagnosis not present

## 2016-03-24 DIAGNOSIS — G629 Polyneuropathy, unspecified: Secondary | ICD-10-CM | POA: Diagnosis not present

## 2016-03-24 DIAGNOSIS — I10 Essential (primary) hypertension: Secondary | ICD-10-CM | POA: Diagnosis not present

## 2016-03-24 DIAGNOSIS — R7303 Prediabetes: Secondary | ICD-10-CM | POA: Diagnosis not present

## 2016-03-31 ENCOUNTER — Ambulatory Visit (INDEPENDENT_AMBULATORY_CARE_PROVIDER_SITE_OTHER): Payer: 59 | Admitting: Diagnostic Neuroimaging

## 2016-03-31 ENCOUNTER — Encounter: Payer: Self-pay | Admitting: Diagnostic Neuroimaging

## 2016-03-31 VITALS — BP 140/80 | HR 88 | Ht 75.0 in | Wt 266.0 lb

## 2016-03-31 DIAGNOSIS — G629 Polyneuropathy, unspecified: Secondary | ICD-10-CM | POA: Diagnosis not present

## 2016-03-31 DIAGNOSIS — G609 Hereditary and idiopathic neuropathy, unspecified: Secondary | ICD-10-CM | POA: Diagnosis not present

## 2016-03-31 NOTE — Patient Instructions (Signed)
-   continue vitamin support (W97, folic acid, alpha-lipoic acid)

## 2016-03-31 NOTE — Progress Notes (Signed)
[facility]  PATIENT: Stephen Dorsey DOB: 05/02/1960  REFERRING CLINICIAN: Merita Norton  HISTORY FROM: patient REASON FOR VISIT: new consult / existing patient    HISTORICAL  CHIEF COMPLAINT:  Chief Complaint  Patient presents with  . Peripheral Neuropathy    rm 7, "my feet are pretty much numb all the time, getting worse; fingers more numb, get stiff, fingertips get cold"    HISTORY OF PRESENT ILLNESS:   NEW HPI 03/31/16: 56 year old male here for re-evaluation of neuropathy. Since last visit, numbness sxs slightly more noticeable. BP and sugars stable. Still with sxs in feet, legs, to knees. Intermittent low back pain. No radiating symptoms. Some more pain in feet with elevated sugar intake in diet. Last A1c 5.8. Patient also concerned about alternate diagnosis such as MS. No other alleviating or aggravating factors.  PRIOR HPI (02/28/13): 56 year old right-handed male with hypertension, elevated blood sugars, here for evaluation of neuropathy. 2006 patient developed onset of numbness, burning sensation in his feet. Symptoms mainly on the top of his feet and his toes. He also developing numbness and painful sensation. Over time this gradually progressed up to his knees. Currently he has no pain. Patient had EMG nerve conduction study in 2008 which was normal. Patient was diagnosed with possible idiopathic small fiber neuropathy. Patient's father had similar symptoms. Patient now resides for reevaluation to see if there is any specific cause. No symptoms in his hands or fingers. He is having some balance issues. Patient also developed edema in his lower extremities recently. He had decreased pedal pulses, was then referred to cardiology for evaluation of peripheral vascular disease. Lower extremity ankle-brachial index and ultrasound testing is normal.    REVIEW OF SYSTEMS: Full 14 system review of systems performed and notable only for decreased sleep numbness snoring  swelling in legs itching.  ALLERGIES: No Known Allergies  HOME MEDICATIONS: No outpatient prescriptions prior to visit.   No facility-administered medications prior to visit.     PAST MEDICAL HISTORY: Past Medical History:  Diagnosis Date  . Chest pain    Occasional CP  . Colon polyps    By colonoscopy in 2009   . Decreased pedal pulses   . Dermatitis   . Edema   . Elevated blood pressure reading without diagnosis of hypertension   . Elevated fasting glucose   . Elevated glucose   . Idiopathic small fiber sensory neuropathy 2008   Dx in 2008. Getting worse, has trouble feeling his feet. No Sx's in arms.  . Kidney stone 1998  . Peripheral neuropathy ([place])     PAST SURGICAL HISTORY: Past Surgical History:  Procedure Laterality Date  . KIDNEY STONE SURGERY      FAMILY HISTORY: Family History  Problem Relation Age of Onset  . Thyroid disease Mother     thyroid problems  . Lung cancer Father   . Thyroid disease Sister   . Diabetes Maternal Grandfather     SOCIAL HISTORY:  Social History   Social History  . Marital status: Married    Spouse name: [name]  . Number of children: 2  . Years of education: College   Occupational History  .      self employed   Social History Main Topics  . Smoking status: Never Smoker  . Smokeless tobacco: Never Used  . Alcohol use 1.2 oz/week    2 Cans of beer per week     Comment: 2+ beers per week  . Drug  use: No  . Sexual activity: Not on file   Other Topics Concern  . Not on file   Social History Narrative   Patient lives at home with family.   Caffeine Use: 20 0z daily     PHYSICAL EXAM  Vitals:   03/31/16 1426  BP: 140/80  Pulse: 88  Weight: 266 lb (120.7 kg)  Height: 6\' 3"  (1.905 m)    Not recorded     Body mass index is 33.25 kg/m.  GENERAL EXAM/CONSTITUTIONAL: Vitals:  Vitals:   03/31/16 1426  BP: 140/80  Pulse: 88  Weight: 266 lb (120.7 kg)  Height: 6\' 3"  (1.905 m)     Patient  is in no distress; well developed, nourished and groomed; neck is supple    CARDIOVASCULAR:  Examination of carotid arteries is normal; no carotid bruits  Regular rate and rhythm, no murmurs  Examination of peripheral vascular system by observation and palpation is normal  DECR DP PULSES  DUSKY TOES AND BOTTOM OF FEET  CAP REFILL > 3 SEC  SMOOTH SKIN IN FEET  TOES ARE LONG, THIN AND ABDUCTED; SLIGHT HAMMER TOE CONFIGURATION  EYES:  Ophthalmoscopic exam of optic discs and posterior segments is normal; no papilledema or hemorrhages  MUSCULOSKELETAL:  Gait, strength, tone, movements noted in Neurologic exam below  NEUROLOGIC: MENTAL STATUS:   awake, alert, oriented to person, place and time  recent and remote memory intact  normal attention and concentration  language fluent, comprehension intact, naming intact,   fund of knowledge appropriate  CRANIAL NERVE:   2nd - no papilledema on fundoscopic exam  2nd, 3rd, 4th, 6th - pupils equal and reactive to light, visual fields full to confrontation, extraocular muscles intact, no nystagmus  5th - facial sensation symmetric  7th - facial strength symmetric  8th - hearing intact  9th - palate elevates symmetrically, uvula midline  11th - shoulder shrug symmetric  12th - tongue protrusion midline  MOTOR:   normal bulk and tone, full strength in the BUE, BLE  SENSORY:   normal and symmetric to light touch  ABSENT PP AND VIB IN TOES  DECR TEMP, VIB, PP IN GRADIENT UP TO KNEES  DECR PP IN FINGERTIPS IN GRADIENT UP ARMS  COORDINATION:   finger-nose-finger, fine finger movements normal  REFLEXES:   deep tendon reflexes present and symmetric  TRACE AT ANKLES (1+ WITH UPPER EXT REINFORCEMENT)  GAIT/STATION:   narrow based gait; able to walk on toes, heels and tandem; romberg is negative      DIAGNOSTIC DATA (LABS, IMAGING, TESTING) - I reviewed patient records, labs, notes, testing and  imaging myself where available.  No results found for: WBC No results found for: NA No results found for: CHOL No results found for: HGBA1C Lab Results  Component Value Date   VITAMINB12 692 02/28/2013   Lab Results  Component Value Date   TSH 4.270 02/28/2013    12/27/06 EMG/NCS - normal  02/22/13 BLE arterial u/s and ABI - normal  Labs - B12 424, TSH 3.92, A1c 5.7, fasting glucose 106  Lipids - CHOL 178, TRIG 80, LDL 120, HDL 42  02/28/13 EMG/NCS - Abnormal study demonstrating length dependent axonal sensory motor polyneuropathy. Concomitant lumbar polyradiculopathy not totally excluded, based on F-wave and H-reflex abnormalities.  02/10/16 A1c - 5.8   ASSESSMENT AND PLAN  56 y.o. year old male here with idiopathic small fiber neuropathy since 2006. Positive family history of neuropathy in father. Mild progression of symptoms over time.  Neuropathy lab workup in 2015 (B12, SPEP, TSH, HIV, lyme, Hep B, Hep C) all negative. 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    Dx:  Neuropathy (HCC)  Idiopathic neuropathy     PLAN:  - no evidence of multiple sclerosis or other CNS etiologies  - no specific treatment available for hereditary neuropathies  - no significant pain at this time  - continue vitamin support (G83, folic acid, alpha-lipoic acid)  - continue follow up of HTN, lipids and glucose per PCP  Return if symptoms worsen or fail to improve, for return to PCP.    Penni Bombard, MD 6/62/9476, 5:46 PM Certified in Neurology, Neurophysiology and Neuroimaging  [facility] [address], [place], [place] [address] [phone]

## 2016-05-03 DIAGNOSIS — G629 Polyneuropathy, unspecified: Secondary | ICD-10-CM | POA: Diagnosis not present

## 2016-05-03 DIAGNOSIS — I10 Essential (primary) hypertension: Secondary | ICD-10-CM | POA: Diagnosis not present

## 2016-05-03 DIAGNOSIS — R7303 Prediabetes: Secondary | ICD-10-CM | POA: Diagnosis not present

## 2016-06-15 DIAGNOSIS — R7303 Prediabetes: Secondary | ICD-10-CM | POA: Diagnosis not present

## 2016-06-15 DIAGNOSIS — I10 Essential (primary) hypertension: Secondary | ICD-10-CM | POA: Diagnosis not present

## 2016-06-18 DIAGNOSIS — R7303 Prediabetes: Secondary | ICD-10-CM | POA: Diagnosis not present

## 2016-06-18 DIAGNOSIS — I10 Essential (primary) hypertension: Secondary | ICD-10-CM | POA: Diagnosis not present

## 2016-06-18 DIAGNOSIS — G629 Polyneuropathy, unspecified: Secondary | ICD-10-CM | POA: Diagnosis not present

## 2016-09-17 DIAGNOSIS — R7303 Prediabetes: Secondary | ICD-10-CM | POA: Diagnosis not present

## 2016-09-21 DIAGNOSIS — G629 Polyneuropathy, unspecified: Secondary | ICD-10-CM | POA: Diagnosis not present

## 2016-09-21 DIAGNOSIS — R7303 Prediabetes: Secondary | ICD-10-CM | POA: Diagnosis not present

## 2016-09-21 DIAGNOSIS — I10 Essential (primary) hypertension: Secondary | ICD-10-CM | POA: Diagnosis not present

## 2016-10-19 DIAGNOSIS — M6740 Ganglion, unspecified site: Secondary | ICD-10-CM | POA: Diagnosis not present

## 2016-10-19 DIAGNOSIS — L570 Actinic keratosis: Secondary | ICD-10-CM | POA: Diagnosis not present

## 2016-10-19 DIAGNOSIS — L720 Epidermal cyst: Secondary | ICD-10-CM | POA: Diagnosis not present

## 2016-12-14 DIAGNOSIS — L720 Epidermal cyst: Secondary | ICD-10-CM | POA: Diagnosis not present

## 2016-12-16 DIAGNOSIS — L720 Epidermal cyst: Secondary | ICD-10-CM | POA: Diagnosis not present

## 2017-03-25 DIAGNOSIS — R7303 Prediabetes: Secondary | ICD-10-CM | POA: Diagnosis not present

## 2017-03-25 DIAGNOSIS — E7800 Pure hypercholesterolemia, unspecified: Secondary | ICD-10-CM | POA: Diagnosis not present

## 2017-03-25 DIAGNOSIS — Z125 Encounter for screening for malignant neoplasm of prostate: Secondary | ICD-10-CM | POA: Diagnosis not present

## 2017-03-25 DIAGNOSIS — I10 Essential (primary) hypertension: Secondary | ICD-10-CM | POA: Diagnosis not present

## 2017-03-25 DIAGNOSIS — E291 Testicular hypofunction: Secondary | ICD-10-CM | POA: Diagnosis not present

## 2017-03-29 DIAGNOSIS — I10 Essential (primary) hypertension: Secondary | ICD-10-CM | POA: Diagnosis not present

## 2017-03-29 DIAGNOSIS — E291 Testicular hypofunction: Secondary | ICD-10-CM | POA: Diagnosis not present

## 2017-03-29 DIAGNOSIS — R7303 Prediabetes: Secondary | ICD-10-CM | POA: Diagnosis not present

## 2017-05-20 DIAGNOSIS — H6981 Other specified disorders of Eustachian tube, right ear: Secondary | ICD-10-CM | POA: Diagnosis not present

## 2017-05-20 DIAGNOSIS — I10 Essential (primary) hypertension: Secondary | ICD-10-CM | POA: Diagnosis not present

## 2017-07-28 DIAGNOSIS — I10 Essential (primary) hypertension: Secondary | ICD-10-CM | POA: Diagnosis not present

## 2017-07-28 DIAGNOSIS — R7303 Prediabetes: Secondary | ICD-10-CM | POA: Diagnosis not present

## 2017-07-28 DIAGNOSIS — E291 Testicular hypofunction: Secondary | ICD-10-CM | POA: Diagnosis not present

## 2017-09-01 DIAGNOSIS — L570 Actinic keratosis: Secondary | ICD-10-CM | POA: Diagnosis not present

## 2017-09-01 DIAGNOSIS — L578 Other skin changes due to chronic exposure to nonionizing radiation: Secondary | ICD-10-CM | POA: Diagnosis not present

## 2017-09-01 DIAGNOSIS — L738 Other specified follicular disorders: Secondary | ICD-10-CM | POA: Diagnosis not present

## 2017-09-01 DIAGNOSIS — D225 Melanocytic nevi of trunk: Secondary | ICD-10-CM | POA: Diagnosis not present

## 2017-10-05 ENCOUNTER — Encounter: Payer: Self-pay | Admitting: Diagnostic Neuroimaging

## 2017-10-05 ENCOUNTER — Ambulatory Visit: Payer: 59 | Admitting: Diagnostic Neuroimaging

## 2017-10-05 VITALS — BP 169/93 | HR 72 | Ht 75.0 in | Wt 245.6 lb

## 2017-10-05 DIAGNOSIS — G609 Hereditary and idiopathic neuropathy, unspecified: Secondary | ICD-10-CM | POA: Diagnosis not present

## 2017-10-05 DIAGNOSIS — G629 Polyneuropathy, unspecified: Secondary | ICD-10-CM

## 2017-10-05 NOTE — Patient Instructions (Signed)
-   check neuropathy genetic testing  - may consider MRI lumbar spine  - may consider gabapentin for symptoms  - continue vitamin support (O11, folic acid, alpha-lipoic acid)  - continue follow up of HTN, lipids and glucose per PCP

## 2017-10-05 NOTE — Progress Notes (Signed)
Lab sample to [facility] by [employer] #[id number].  10-05-17 sy

## 2017-10-05 NOTE — Progress Notes (Signed)
[facility]  PATIENT: Stephen Dorsey DOB: June 27, 1960  REFERRING CLINICIAN: Merita Norton  HISTORY FROM: patient REASON FOR VISIT: follow up   HISTORICAL  CHIEF COMPLAINT:  Chief Complaint  Patient presents with  . NX/  Dr. Jacelyn Grip    Rm 7, alone  . Peripheral Neuropathy    last seen here 03/2016, progressively worse.  L>R (feet to mid thigh bilateral)    HISTORY OF PRESENT ILLNESS:   UPDATE (10/05/17, VRP): Since last visit, symptoms slightly progressive and patient requesting reevaluation of symptoms.  No major pain or discomfort but occasionally has burning sensation.  Symptoms are noted up to the knees.  He has some symptoms in his fingertips. No alleviating or aggravating factors.    NEW HPI 03/31/16: 57 year old male here for re-evaluation of neuropathy. Since last visit, numbness sxs slightly more noticeable. BP and sugars stable. Still with sxs in feet, legs, to knees. Intermittent low back pain. No radiating symptoms. Some more pain in feet with elevated sugar intake in diet. Last A1c 5.8. Patient also concerned about alternate diagnosis such as MS. No other alleviating or aggravating factors.  PRIOR HPI (02/28/13): 57 year old right-handed male with hypertension, elevated blood sugars, here for evaluation of neuropathy. 2006 patient developed onset of numbness, burning sensation in his feet. Symptoms mainly on the top of his feet and his toes. He also developing numbness and painful sensation. Over time this gradually progressed up to his knees. Currently he has no pain. Patient had EMG nerve conduction study in 2008 which was normal. Patient was diagnosed with possible idiopathic small fiber neuropathy. Patient's father had similar symptoms. Patient now resides for reevaluation to see if there is any specific cause. No symptoms in his hands or fingers. He is having some balance issues. Patient also developed edema in his lower extremities recently. He had decreased pedal  pulses, was then referred to cardiology for evaluation of peripheral vascular disease. Lower extremity ankle-brachial index and ultrasound testing is normal.   REVIEW OF SYSTEMS: Full 14 system review of systems performed and negative except: numbness cramps.   ALLERGIES: No Known Allergies  HOME MEDICATIONS: Outpatient Medications Prior to Visit  Medication Sig Dispense Refill  . b complex vitamins tablet Take 1 tablet by mouth daily.    . Calcium Carbonate-Vit D-Min (CALCIUM 1200 PO) Take by mouth daily.    . Cyanocobalamin (VITAMIN B-12) 5000 MCG TBDP Take by mouth daily.    . folic acid (FOLVITE) 443 MCG tablet Take 400 mcg by mouth daily.    [name] lisinopril-hydrochlorothiazide (PRINZIDE,ZESTORETIC) 20-12.5 MG tablet daily. 20-12.5 mg    . Magnesium 500 MG CAPS Take by mouth. Daily    . Omega-3 Fatty Acids (FISH OIL PO) Take 1,400 mg by mouth daily.    . Potassium 99 MG TABS Take by mouth. Daily    . VITAMIN D, CHOLECALCIFEROL, PO Take 125 mg by mouth daily.    . folic acid (FOLVITE) 1 MG tablet Take 1 mg by mouth daily.    . vitamin B-12 (CYANOCOBALAMIN) 100 MCG tablet Take 100 mcg by mouth daily.     No facility-administered medications prior to visit.     PAST MEDICAL HISTORY: Past Medical History:  Diagnosis Date  . Chest pain    Occasional CP  . Colon polyps    By colonoscopy in 2009   . Decreased pedal pulses   . Dermatitis   . Edema   . Elevated blood pressure reading without diagnosis of  hypertension   . Elevated fasting glucose   . Elevated glucose   . Idiopathic small fiber sensory neuropathy 2008   Dx in 2008. Getting worse, has trouble feeling his feet. No Sx's in arms.  . Kidney stone 1998  . Peripheral neuropathy     PAST SURGICAL HISTORY: Past Surgical History:  Procedure Laterality Date  . KIDNEY STONE SURGERY      FAMILY HISTORY: Family History  Problem Relation Age of Onset  . Thyroid disease Mother        thyroid problems  . Lung cancer  Father   . Thyroid disease Sister   . Diabetes Maternal Grandfather     SOCIAL HISTORY:  Social History   Socioeconomic History  . Marital status: Married    Spouse name: [name]  . Number of children: 2  . Years of education: College  . Highest education level: Not on file  Occupational History    Comment: self employed  Social Needs  . Financial resource strain: Not on file  . Food insecurity:    Worry: Not on file    Inability: Not on file  . Transportation needs:    Medical: Not on file    Non-medical: Not on file  Tobacco Use  . Smoking status: Never Smoker  . Smokeless tobacco: Never Used  Substance and Sexual Activity  . Alcohol use: Yes    Alcohol/week: 2.0 standard drinks    Types: 2 Cans of beer per week    Comment: 2+ beers per week  . Drug use: No  . Sexual activity: Not on file  Lifestyle  . Physical activity:    Days per week: Not on file    Minutes per session: Not on file  . Stress: Not on file  Relationships  . Social connections:    Talks on phone: Not on file    Gets together: Not on file    Attends religious service: Not on file    Active member of club or organization: Not on file    Attends meetings of clubs or organizations: Not on file    Relationship status: Not on file  . Intimate partner violence:    Fear of current or ex partner: Not on file    Emotionally abused: Not on file    Physically abused: Not on file    Forced sexual activity: Not on file  Other Topics Concern  . Not on file  Social History Narrative   Patient lives at home with family.  Works for [employer].  Education 4 yr college.  [employer].   Caffeine Use: 20 0z daily     PHYSICAL EXAM  Vitals:   10/05/17 0815  BP: (!) 169/93  Pulse: 72  Weight: 245 lb 9.6 oz (111.4 kg)  Height: 6\' 3"  (1.905 m)    Not recorded     Body mass index is 30.7 kg/m.  GENERAL EXAM/CONSTITUTIONAL: Vitals:  Vitals:   10/05/17 0815  BP: (!) 169/93  Pulse: 72  Weight: 245  lb 9.6 oz (111.4 kg)  Height: 6\' 3"  (1.905 m)    Patient is in no distress; well developed, nourished and groomed; neck is supple    CARDIOVASCULAR:  Examination of carotid arteries is normal; no carotid bruits  Regular rate and rhythm, no murmurs  Examination of peripheral vascular system by observation and palpation is normal  EYES:  Ophthalmoscopic exam of optic discs and posterior segments is normal; no papilledema or hemorrhages  MUSCULOSKELETAL:  Gait, strength,  tone, movements noted in Neurologic exam below  NEUROLOGIC: MENTAL STATUS:   awake, alert, oriented to person, place and time  recent and remote memory intact  normal attention and concentration  language fluent, comprehension intact, naming intact,   fund of knowledge appropriate  CRANIAL NERVE:   2nd - no papilledema on fundoscopic exam  2nd, 3rd, 4th, 6th - pupils equal and reactive to light, visual fields full to confrontation, extraocular muscles intact, no nystagmus  5th - facial sensation symmetric  7th - facial strength symmetric  8th - hearing intact  9th - palate elevates symmetrically, uvula midline  11th - shoulder shrug symmetric  12th - tongue protrusion midline  MOTOR:   normal bulk and tone, full strength in the BUE, BLE  SENSORY:   normal and symmetric to light touch  ABSENT VIB IN TOES  COORDINATION:   finger-nose-finger, fine finger movements normal  REFLEXES:   deep tendon reflexes present and symmetric  TRACE AT ANKLES  GAIT/STATION:   narrow based gait      DIAGNOSTIC DATA (LABS, IMAGING, TESTING) - I reviewed patient records, labs, notes, testing and imaging myself where available.  No results found for: WBC No results found for: NA No results found for: CHOL No results found for: HGBA1C Lab Results  Component Value Date   VITAMINB12 692 02/28/2013   Lab Results  Component Value Date   TSH 4.270 02/28/2013    12/27/06 EMG/NCS -  normal  02/22/13 BLE arterial u/s and ABI - normal  Labs - B12 424, TSH 3.92, A1c 5.7, fasting glucose 106  Lipids - CHOL 178, TRIG 80, LDL 120, HDL 42  02/28/13 EMG/NCS - Abnormal study demonstrating length dependent axonal sensory motor polyneuropathy. Concomitant lumbar polyradiculopathy not totally excluded, based on F-wave and H-reflex abnormalities.  02/10/16 A1c - 5.8   ASSESSMENT AND PLAN  57 y.o. year old male here with idiopathic small fiber neuropathy since 2006. Positive family history of neuropathy in father. Mild progression of symptoms over time. Neuropathy lab workup in 2015 (B12, SPEP, TSH, HIV, lyme, Hep B, Hep C) all negative. 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    Dx:  Idiopathic neuropathy  Neuropathy     PLAN:  - check neuropathy genetic testing  - may consider MRI lumbar spine  - may consider gabapentin for symptoms  - continue vitamin support (Q67, folic acid, alpha-lipoic acid)  - continue follow up of HTN, lipids and glucose per PCP  Return if symptoms worsen or fail to improve, for return to PCP.    Penni Bombard, MD 06/30/5091, 2:67 AM Certified in Neurology, Neurophysiology and Neuroimaging  [facility] [address] [place], [place] [address] [phone]

## 2017-10-12 ENCOUNTER — Telehealth: Payer: Self-pay | Admitting: *Deleted

## 2017-10-12 NOTE — Telephone Encounter (Signed)
Checked [employer], Lab still pending.  10/12/2017 [id number] sy.

## 2017-10-18 NOTE — Telephone Encounter (Signed)
Received results from [facility].  Negative result, relayed to pt.  ? Next?  Your note states consider MRI lumbar spine.  Pt was doing about same.

## 2017-10-19 NOTE — Telephone Encounter (Signed)
Monitor symptoms for now. In future could [facility] lumbar [facility] Follow up with PCP for now. -VRP

## 2017-12-06 DIAGNOSIS — E7800 Pure hypercholesterolemia, unspecified: Secondary | ICD-10-CM | POA: Diagnosis not present

## 2017-12-06 DIAGNOSIS — R7989 Other specified abnormal findings of blood chemistry: Secondary | ICD-10-CM | POA: Diagnosis not present

## 2017-12-06 DIAGNOSIS — I10 Essential (primary) hypertension: Secondary | ICD-10-CM | POA: Diagnosis not present

## 2017-12-06 DIAGNOSIS — E291 Testicular hypofunction: Secondary | ICD-10-CM | POA: Diagnosis not present

## 2017-12-06 DIAGNOSIS — R7303 Prediabetes: Secondary | ICD-10-CM | POA: Diagnosis not present

## 2019-02-15 ENCOUNTER — Encounter: Payer: Self-pay | Admitting: Physician Assistant

## 2019-02-15 ENCOUNTER — Other Ambulatory Visit: Payer: Self-pay | Admitting: Physician Assistant

## 2019-02-15 ENCOUNTER — Ambulatory Visit (HOSPITAL_COMMUNITY)
Admission: RE | Admit: 2019-02-15 | Discharge: 2019-02-15 | Disposition: A | Discharge status: Home / Self Care | Payer: 59 | Source: Ambulatory Visit | Attending: Pulmonary Disease | Admitting: Pulmonary Disease

## 2019-02-15 DIAGNOSIS — U071 COVID-19: Secondary | ICD-10-CM

## 2019-02-15 DIAGNOSIS — I10 Essential (primary) hypertension: Secondary | ICD-10-CM

## 2019-02-15 DIAGNOSIS — R0602 Shortness of breath: Secondary | ICD-10-CM | POA: Diagnosis not present

## 2019-02-15 HISTORY — DX: Essential (primary) hypertension: I10

## 2019-02-15 MED ORDER — ALBUTEROL SULFATE HFA 108 (90 BASE) MCG/ACT IN AERS: 2.0000 | INHALATION_SPRAY | Freq: Once | RESPIRATORY_TRACT | Status: DC | PRN

## 2019-02-15 MED ORDER — SODIUM CHLORIDE 0.9 % IV SOLN
700.0000 mg | Freq: Once | INTRAVENOUS | Status: AC
  Administered 2019-02-15: 700 mg via INTRAVENOUS
  Filled 2019-02-15: qty 20

## 2019-02-15 MED ORDER — DIPHENHYDRAMINE HCL 50 MG/ML IJ SOLN: 50.0000 mg | Freq: Once | INTRAMUSCULAR | Status: DC | PRN

## 2019-02-15 MED ORDER — EPINEPHRINE 0.3 MG/0.3ML IJ SOAJ: 0.3000 mg | Freq: Once | INTRAMUSCULAR | Status: DC | PRN

## 2019-02-15 MED ORDER — FAMOTIDINE IN NACL 20-0.9 MG/50ML-% IV SOLN: 20.0000 mg | Freq: Once | INTRAVENOUS | Status: DC | PRN

## 2019-02-15 MED ORDER — SODIUM CHLORIDE 0.9 % IV SOLN
INTRAVENOUS | Status: DC | PRN
  Administered 2019-02-15: 250 mL via INTRAVENOUS

## 2019-02-15 MED ORDER — METHYLPREDNISOLONE SODIUM SUCC 125 MG IJ SOLR: 125.0000 mg | Freq: Once | INTRAMUSCULAR | Status: DC | PRN

## 2019-02-15 NOTE — Discharge Instructions (Signed)

## 2019-02-15 NOTE — Progress Notes (Signed)
  Diagnosis: COVID-19  Physician: Dr. Joya Gaskins  Procedure: [facility] Med: bamlanivimab infusion - Provided patient with bamlanimivab fact sheet for patients, parents and caregivers prior to infusion.  Complications: No immediate complications noted.  Discharge: Discharged home   Babs Sciara 02/15/2019

## 2019-02-15 NOTE — Progress Notes (Signed)
  I connected by phone with Stephen Dorsey on 02/15/2019 at 11:39 AM to discuss the potential use of an new treatment for mild to moderate COVID-19 viral infection in non-hospitalized patients.  This patient is a 59 y.o. male that meets the FDA criteria for Emergency Use Authorization of bamlanivimab or casirivimab\imdevimab.  Has a (+) direct SARS-CoV-2 viral test result  Has mild or moderate COVID-19   Is ? 59 years of age and weighs ? 40 kg  Is NOT hospitalized due to COVID-19  Is NOT requiring oxygen therapy or requiring an increase in baseline oxygen flow rate due to COVID-19  Is within 10 days of symptom onset  Has at least one of the high risk factor(s) for progression to severe COVID-19 and/or hospitalization as defined in EUA.  Specific high risk criteria : Hypertension   I have spoken and communicated the following to the patient or parent/caregiver:  1. FDA has authorized the emergency use of bamlanivimab and casirivimab\imdevimab for the treatment of mild to moderate COVID-19 in adults and pediatric patients with positive results of direct SARS-CoV-2 viral testing who are 33 years of age and older weighing at least 40 kg, and who are at high risk for progressing to severe COVID-19 and/or hospitalization.  2. The significant known and potential risks and benefits of bamlanivimab and casirivimab\imdevimab, and the extent to which such potential risks and benefits are unknown.  3. Information on available alternative treatments and the risks and benefits of those alternatives, including clinical trials.  4. Patients treated with bamlanivimab and casirivimab\imdevimab should continue to self-isolate and use infection control measures (e.g., wear mask, isolate, social distance, avoid sharing personal items, clean and disinfect "high touch" surfaces, and frequent handwashing) according to CDC guidelines.   5. The patient or parent/caregiver has the option to accept or refuse  bamlanivimab or casirivimab\imdevimab .  After reviewing this information with the patient, The patient agreed to proceed with receiving the bamlanimivab infusion and will be provided a copy of the Fact sheet prior to receiving the infusion.   He will come today at 4:30pm for infusion. Told to bring a copy of his test in my chart message. Directions given. Sx onset 1/27.  Angelena Form PA-C 02/15/2019 11:39 AM

## 2019-02-17 ENCOUNTER — Inpatient Hospital Stay (HOSPITAL_COMMUNITY)
Admission: EM | Admit: 2019-02-17 | Discharge: 2019-02-19 | DRG: 177 | Disposition: A | Discharge status: Home / Self Care | Payer: 59 | Attending: Internal Medicine | Admitting: Internal Medicine

## 2019-02-17 ENCOUNTER — Encounter (HOSPITAL_COMMUNITY): Payer: Self-pay

## 2019-02-17 ENCOUNTER — Emergency Department (HOSPITAL_COMMUNITY): Payer: 59

## 2019-02-17 ENCOUNTER — Other Ambulatory Visit: Payer: Self-pay

## 2019-02-17 DIAGNOSIS — I10 Essential (primary) hypertension: Secondary | ICD-10-CM | POA: Diagnosis present

## 2019-02-17 DIAGNOSIS — Z23 Encounter for immunization: Secondary | ICD-10-CM

## 2019-02-17 DIAGNOSIS — Z6832 Body mass index (BMI) 32.0-32.9, adult: Secondary | ICD-10-CM

## 2019-02-17 DIAGNOSIS — U071 COVID-19: Secondary | ICD-10-CM

## 2019-02-17 DIAGNOSIS — Z86010 Personal history of colonic polyps: Secondary | ICD-10-CM

## 2019-02-17 DIAGNOSIS — E669 Obesity, unspecified: Secondary | ICD-10-CM | POA: Diagnosis present

## 2019-02-17 DIAGNOSIS — Z801 Family history of malignant neoplasm of trachea, bronchus and lung: Secondary | ICD-10-CM

## 2019-02-17 DIAGNOSIS — E785 Hyperlipidemia, unspecified: Secondary | ICD-10-CM

## 2019-02-17 DIAGNOSIS — J1282 Pneumonia due to coronavirus disease 2019: Secondary | ICD-10-CM | POA: Diagnosis not present

## 2019-02-17 DIAGNOSIS — J9601 Acute respiratory failure with hypoxia: Secondary | ICD-10-CM

## 2019-02-17 DIAGNOSIS — R0602 Shortness of breath: Secondary | ICD-10-CM

## 2019-02-17 DIAGNOSIS — Z87442 Personal history of urinary calculi: Secondary | ICD-10-CM

## 2019-02-17 DIAGNOSIS — Z8349 Family history of other endocrine, nutritional and metabolic diseases: Secondary | ICD-10-CM

## 2019-02-17 DIAGNOSIS — E119 Type 2 diabetes mellitus without complications: Secondary | ICD-10-CM | POA: Diagnosis not present

## 2019-02-17 DIAGNOSIS — Z833 Family history of diabetes mellitus: Secondary | ICD-10-CM

## 2019-02-17 DIAGNOSIS — E875 Hyperkalemia: Secondary | ICD-10-CM | POA: Diagnosis present

## 2019-02-17 DIAGNOSIS — E1142 Type 2 diabetes mellitus with diabetic polyneuropathy: Secondary | ICD-10-CM | POA: Diagnosis present

## 2019-02-17 DIAGNOSIS — E1169 Type 2 diabetes mellitus with other specified complication: Secondary | ICD-10-CM

## 2019-02-17 DIAGNOSIS — Z79899 Other long term (current) drug therapy: Secondary | ICD-10-CM

## 2019-02-17 HISTORY — DX: Pneumonia due to coronavirus disease 2019: J12.82

## 2019-02-17 HISTORY — DX: COVID-19: U07.1

## 2019-02-17 LAB — COMPREHENSIVE METABOLIC PANEL
ALT: 72 U/L — ABNORMAL HIGH (ref 0–44)
AST: 53 U/L — ABNORMAL HIGH (ref 15–41)
Albumin: 3 g/dL — ABNORMAL LOW (ref 3.5–5.0)
Alkaline Phosphatase: 40 U/L (ref 38–126)
Anion gap: 11 (ref 5–15)
BUN: 14 mg/dL (ref 6–20)
CO2: 24 mmol/L (ref 22–32)
Calcium: 8.4 mg/dL — ABNORMAL LOW (ref 8.9–10.3)
Chloride: 99 mmol/L (ref 98–111)
Creatinine, Ser: 1.15 mg/dL (ref 0.61–1.24)
GFR calc Af Amer: >60 mL/min (ref >60)
GFR calc non Af Amer: >60 mL/min (ref >60)
Glucose, Bld: 169 mg/dL — ABNORMAL HIGH (ref 70–99)
Potassium: 4.1 mmol/L (ref 3.5–5.1)
Sodium: 134 mmol/L — ABNORMAL LOW (ref 135–145)
Total Bilirubin: 0.6 mg/dL (ref 0.3–1.2)
Total Protein: 6.2 g/dL — ABNORMAL LOW (ref 6.5–8.1)

## 2019-02-17 LAB — CBC WITH DIFFERENTIAL/PLATELET
Abs Immature Granulocytes: 0.03 10*3/uL (ref 0.00–0.07)
Basophils Absolute: 0 10*3/uL (ref 0.0–0.1)
Basophils Relative: 0 %
Eosinophils Absolute: 0 10*3/uL (ref 0.0–0.5)
Eosinophils Relative: 0 %
HCT: 42 % (ref 39.0–52.0)
Hemoglobin: 14.1 g/dL (ref 13.0–17.0)
Immature Granulocytes: 0 %
Lymphocytes Relative: 17 %
Lymphs Abs: 1.2 10*3/uL (ref 0.7–4.0)
MCH: 29.6 pg (ref 26.0–34.0)
MCHC: 33.6 g/dL (ref 30.0–36.0)
MCV: 88.1 fL (ref 80.0–100.0)
Monocytes Absolute: 0.5 10*3/uL (ref 0.1–1.0)
Monocytes Relative: 7 %
Neutro Abs: 5.2 10*3/uL (ref 1.7–7.7)
Neutrophils Relative %: 76 %
Platelets: 162 10*3/uL (ref 150–400)
RBC: 4.77 MIL/uL (ref 4.22–5.81)
RDW: 14 % (ref 11.5–15.5)
WBC: 6.8 10*3/uL (ref 4.0–10.5)
nRBC: 0 % (ref 0.0–0.2)

## 2019-02-17 LAB — HEMOGLOBIN A1C
Hgb A1c MFr Bld: 6.3 % — ABNORMAL HIGH (ref 4.8–5.6)
Mean Plasma Glucose: 134.11 mg/dL

## 2019-02-17 LAB — FERRITIN: Ferritin: 1759 ng/mL — ABNORMAL HIGH (ref 24–336)

## 2019-02-17 LAB — HIV ANTIBODY (ROUTINE TESTING W REFLEX): HIV Screen 4th Generation wRfx: NONREACTIVE

## 2019-02-17 LAB — TRIGLYCERIDES: Triglycerides: 90 mg/dL (ref <150)

## 2019-02-17 LAB — LACTIC ACID, PLASMA
Lactic Acid, Venous: 1.5 mmol/L (ref 0.5–1.9)
Lactic Acid, Venous: 1.8 mmol/L (ref 0.5–1.9)

## 2019-02-17 LAB — LACTATE DEHYDROGENASE: LDH: 359 U/L — ABNORMAL HIGH (ref 98–192)

## 2019-02-17 LAB — FIBRINOGEN: Fibrinogen: 746 mg/dL — ABNORMAL HIGH (ref 210–475)

## 2019-02-17 LAB — PROCALCITONIN: Procalcitonin: <0.1 ng/mL

## 2019-02-17 LAB — D-DIMER, QUANTITATIVE: D-Dimer, Quant: 1.4 ug/mL-FEU — ABNORMAL HIGH (ref 0.00–0.50)

## 2019-02-17 LAB — ABO/RH: ABO/RH(D): A NEG

## 2019-02-17 LAB — C-REACTIVE PROTEIN: CRP: 15.5 mg/dL — ABNORMAL HIGH (ref <1.0)

## 2019-02-17 MED ORDER — SODIUM CHLORIDE 0.9 % IV SOLN
200.0000 mg | Freq: Once | INTRAVENOUS | Status: AC
  Administered 2019-02-17: 200 mg via INTRAVENOUS
  Filled 2019-02-17: qty 200

## 2019-02-17 MED ORDER — TRAZODONE HCL 50 MG PO TABS: 25.0000 mg | ORAL_TABLET | Freq: Every evening | ORAL | Status: DC | PRN

## 2019-02-17 MED ORDER — ALBUTEROL SULFATE HFA 108 (90 BASE) MCG/ACT IN AERS
1.0000 | INHALATION_SPRAY | RESPIRATORY_TRACT | Status: DC | PRN
  Filled 2019-02-17: qty 6.7

## 2019-02-17 MED ORDER — CALCIUM CARBONATE ANTACID 500 MG PO CHEW
400.0000 mg | CHEWABLE_TABLET | Freq: Once | ORAL | Status: AC
  Administered 2019-02-17: 400 mg via ORAL
  Filled 2019-02-17: qty 2

## 2019-02-17 MED ORDER — ACETAMINOPHEN 325 MG PO TABS
650.0000 mg | ORAL_TABLET | Freq: Four times a day (QID) | ORAL | Status: DC | PRN
  Administered 2019-02-17: 650 mg via ORAL
  Filled 2019-02-17: qty 2

## 2019-02-17 MED ORDER — DEXAMETHASONE SODIUM PHOSPHATE 10 MG/ML IJ SOLN
6.0000 mg | Freq: Once | INTRAMUSCULAR | Status: AC
  Administered 2019-02-17: 6 mg via INTRAVENOUS
  Filled 2019-02-17: qty 1

## 2019-02-17 MED ORDER — SODIUM CHLORIDE 0.9 % IV SOLN: 250.0000 mL | INTRAVENOUS | Status: DC | PRN

## 2019-02-17 MED ORDER — INSULIN DETEMIR 100 UNIT/ML ~~LOC~~ SOLN: 0.1500 [IU]/kg | Freq: Two times a day (BID) | SUBCUTANEOUS | Status: DC

## 2019-02-17 MED ORDER — SODIUM CHLORIDE 0.9% FLUSH: 3.0000 mL | INTRAVENOUS | Status: DC | PRN

## 2019-02-17 MED ORDER — LISINOPRIL 40 MG PO TABS
40.0000 mg | ORAL_TABLET | Freq: Every day | ORAL | Status: DC
  Administered 2019-02-18 – 2019-02-19 (×2): 40 mg via ORAL
  Filled 2019-02-17 (×2): qty 1

## 2019-02-17 MED ORDER — ALUM & MAG HYDROXIDE-SIMETH 200-200-20 MG/5ML PO SUSP
15.0000 mL | ORAL | Status: DC | PRN
  Filled 2019-02-17: qty 30

## 2019-02-17 MED ORDER — SODIUM CHLORIDE 0.9 % IV SOLN
100.0000 mg | Freq: Every day | INTRAVENOUS | Status: DC
  Administered 2019-02-18 – 2019-02-19 (×2): 100 mg via INTRAVENOUS
  Filled 2019-02-17 (×2): qty 20

## 2019-02-17 MED ORDER — SODIUM CHLORIDE 0.9% FLUSH
3.0000 mL | Freq: Two times a day (BID) | INTRAVENOUS | Status: DC
  Administered 2019-02-17 – 2019-02-19 (×4): 3 mL via INTRAVENOUS

## 2019-02-17 MED ORDER — LINAGLIPTIN 5 MG PO TABS
5.0000 mg | ORAL_TABLET | Freq: Every day | ORAL | Status: DC
  Filled 2019-02-17: qty 1

## 2019-02-17 MED ORDER — ENOXAPARIN SODIUM 40 MG/0.4ML ~~LOC~~ SOLN
40.0000 mg | SUBCUTANEOUS | Status: DC
  Administered 2019-02-17 – 2019-02-18 (×2): 40 mg via SUBCUTANEOUS
  Filled 2019-02-17 (×2): qty 0.4

## 2019-02-17 MED ORDER — DEXAMETHASONE 6 MG PO TABS
6.0000 mg | ORAL_TABLET | ORAL | Status: DC
  Administered 2019-02-17: 6 mg via ORAL
  Filled 2019-02-17: qty 1

## 2019-02-17 NOTE — ED Notes (Signed)
Pt sat up at the side of the bed, spo2 dropped to 86% on RA. Pt then ambulated in room and spo2 was 86-% on RA.

## 2019-02-17 NOTE — Plan of Care (Signed)
  Problem: Education: Goal: Knowledge of General Education information will improve Description Including pain rating scale, medication(s)/side effects and non-pharmacologic comfort measures Outcome: Progressing   Problem: Health Behavior/Discharge Planning: Goal: Ability to manage health-related needs will improve Outcome: Progressing   

## 2019-02-17 NOTE — ED Provider Notes (Signed)
[facility] EMERGENCY DEPARTMENT Provider Note   CSN: [id number] Arrival date & time: 02/17/19  1331     History Chief Complaint  Patient presents with  . COVID +  . Fatigue    Stephen Dorsey is a 59 y.o. male.  He said he started with Covid-like symptoms of fever cough fatigue a little over a week ago.  He tested positive a few days after that.  He went to the Covid clinic 2 days ago and got an infusion of the bamlanivimab or casirivimab\imdevimab.  He continues to have fatigue.  Fevers have abated.  Mild shortness of breath.  Home pulse ox ranges from 88 to 92%.  He talked to his primary care doctor who recommended he come to the emergency department to make sure he did not have a blood clot.  He was hoping just to be prescribed some oxygen so he can go home.  No chest pain currently but he said he had some chest pain last week when his symptoms started.  Cough productive of some brown sputum infrequent.  The history is provided by the patient.  Influenza Presenting symptoms: cough, fatigue, fever, myalgias and shortness of breath   Presenting symptoms: no sore throat   Severity:  Mild Onset quality:  Gradual Duration:  10 days Progression:  Waxing and waning Chronicity:  New Relieved by:  Nothing Worsened by:  Movement Ineffective treatments:  Rest Associated symptoms: decreased appetite and decreased physical activity   Associated symptoms: no chills        Past Medical History:  Diagnosis Date  . Chest pain    Occasional CP  . Colon polyps    By colonoscopy in 2009   . Decreased pedal pulses   . Dermatitis   . Edema   . Elevated fasting glucose   . Elevated glucose   . Hypertension   . Idiopathic small fiber sensory neuropathy 2008   Dx in 2008. Getting worse, has trouble feeling his feet. No Sx's in arms.  . Kidney stone 1998  . Peripheral neuropathy     Patient Active Problem List   Diagnosis Date Noted  . Edema 05/10/2013  . Type II or  unspecified type diabetes mellitus without mention of complication, not stated as uncontrolled 05/10/2013  . Abnormal stress test 05/10/2013    Past Surgical History:  Procedure Laterality Date  . KIDNEY STONE SURGERY         Family History  Problem Relation Age of Onset  . Thyroid disease Mother        thyroid problems  . Lung cancer Father   . Thyroid disease Sister   . Diabetes Maternal Grandfather     Social History   Tobacco Use  . Smoking status: Never Smoker  . Smokeless tobacco: Never Used  Substance Use Topics  . Alcohol use: Yes    Alcohol/week: 2.0 standard drinks    Types: 2 Cans of beer per week    Comment: 2+ beers per week  . Drug use: No    Home Medications Prior to Admission medications   Medication Sig Start Date End Date Taking? Authorizing Provider  b complex vitamins tablet Take 1 tablet by mouth daily.    [provider]  Calcium Carbonate-Vit D-Min (CALCIUM 1200 PO) Take by mouth daily.    [provider]  Cyanocobalamin (VITAMIN B-12) 5000 MCG TBDP Take by mouth daily.    [provider]  folic acid (FOLVITE) Q000111Q MCG tablet Take  400 mcg by mouth daily.    [provider]  lisinopril-hydrochlorothiazide (PRINZIDE,ZESTORETIC) 20-12.5 MG tablet daily. 20-12.5 mg 03/24/16   [provider]  Magnesium 500 MG CAPS Take by mouth. Daily    [provider]  Omega-3 Fatty Acids (FISH OIL PO) Take 1,400 mg by mouth daily.    [provider]  Potassium 99 MG TABS Take by mouth. Daily    [provider]  VITAMIN D, CHOLECALCIFEROL, PO Take 125 mg by mouth daily.    [provider]    Allergies    Patient has no known allergies.  Review of Systems   Review of Systems  Constitutional: Positive for activity change, appetite change, decreased appetite, fatigue and fever. Negative for chills.  HENT: Negative for sore throat.   Eyes: Negative for visual disturbance.    Respiratory: Positive for cough and shortness of breath.   Cardiovascular: Positive for chest pain.  Gastrointestinal: Negative for abdominal pain.  Genitourinary: Negative for dysuria.  Musculoskeletal: Positive for myalgias.  Skin: Negative for rash.  Neurological: Negative for syncope.    Physical Exam Updated Vital Signs BP (!) 143/69 (BP Location: Right Arm)   Pulse 88   Temp 97.7 F (36.5 C) (Oral)   Resp 16   Ht 6\' 3"  (1.905 m)   Wt 116.6 kg   SpO2 93%   BMI 32.12 kg/m   Physical Exam Vitals and nursing note reviewed.  Constitutional:      Appearance: He is well-developed.  HENT:     Head: Normocephalic and atraumatic.  Eyes:     Conjunctiva/sclera: Conjunctivae normal.  Cardiovascular:     Rate and Rhythm: Normal rate and regular rhythm.     Heart sounds: No murmur.  Pulmonary:     Effort: Pulmonary effort is normal. No respiratory distress.     Breath sounds: Normal breath sounds.  Abdominal:     Palpations: Abdomen is soft.     Tenderness: There is no abdominal tenderness.  Musculoskeletal:        General: Normal range of motion.     Cervical back: Neck supple.     Right lower leg: No edema.     Left lower leg: No edema.  Skin:    General: Skin is warm and dry.     Capillary Refill: Capillary refill takes less than 2 seconds.  Neurological:     General: No focal deficit present.     Mental Status: He is alert.     ED Results / Procedures / Treatments   Labs (all labs ordered are listed, but only abnormal results are displayed) Labs Reviewed  COMPREHENSIVE METABOLIC PANEL - Abnormal; Notable for the following components:      Result Value   Sodium 134 (*)    Glucose, Bld 169 (*)    Calcium 8.4 (*)    Total Protein 6.2 (*)    Albumin 3.0 (*)    AST 53 (*)    ALT 72 (*)    All other components within normal limits  D-DIMER, QUANTITATIVE (NOT AT [facility]) - Abnormal; Notable for the following components:   D-Dimer, Quant 1.40 (*)    All other  components within normal limits  LACTATE DEHYDROGENASE - Abnormal; Notable for the following components:   LDH 359 (*)    All other components within normal limits  FERRITIN - Abnormal; Notable for the following components:   Ferritin 1,759 (*)    All other components within normal limits  FIBRINOGEN -  Abnormal; Notable for the following components:   Fibrinogen 746 (*)    All other components within normal limits  C-REACTIVE PROTEIN - Abnormal; Notable for the following components:   CRP 15.5 (*)    All other components within normal limits  HEMOGLOBIN A1C - Abnormal; Notable for the following components:   Hgb A1c MFr Bld 6.3 (*)    All other components within normal limits  CULTURE, BLOOD (ROUTINE X 2)  CULTURE, BLOOD (ROUTINE X 2)  LACTIC ACID, PLASMA  LACTIC ACID, PLASMA  CBC WITH DIFFERENTIAL/PLATELET  PROCALCITONIN  TRIGLYCERIDES  HIV ANTIBODY (ROUTINE TESTING W REFLEX)  COMPREHENSIVE METABOLIC PANEL  CBC WITH DIFFERENTIAL/PLATELET  C-REACTIVE PROTEIN  ABO/RH    EKG None  Radiology DG Chest Port 1 View  Result Date: 02/17/2019 CLINICAL DATA:  59 year old male with fatigue and hypoxia. Recent diagnosis of COVID. EXAM: PORTABLE CHEST 1 VIEW COMPARISON:  None. FINDINGS: Very mild patchy airspace opacity in the right mid and lower lung. The left lung remains relatively clear. The cardiac and mediastinal contours are within normal limits. No pneumothorax or pleural effusion. No acute osseous abnormality. IMPRESSION: Mild patchy airspace opacities in the right mid lung and right lower lobe which could reflect early pneumonia, including COVID pneumonia. Electronically Signed   By: Jacqulynn Cadet M.D.   On: 02/17/2019 14:39    Procedures .Critical Care Performed by: Hayden Rasmussen, MD Authorized by: Hayden Rasmussen, MD   Critical care provider statement:    Critical care time (minutes):  45   Critical care time was exclusive of:  Separately billable procedures and  treating other patients   Critical care was necessary to treat or prevent imminent or life-threatening deterioration of the following conditions:  Respiratory failure   Critical care was time spent personally by me on the following activities:  Discussions with consultants, evaluation of patient's response to treatment, examination of patient, ordering and performing treatments and interventions, ordering and review of laboratory studies, ordering and review of radiographic studies, pulse oximetry, re-evaluation of patient's condition, obtaining history from patient or surrogate, review of old charts and development of treatment plan with patient or surrogate   (including critical care time)  Medications Ordered in ED Medications  alum & mag hydroxide-simeth (MAALOX/MYLANTA) 200-200-20 MG/5ML suspension 15 mL (has no administration in time range)  lisinopril (ZESTRIL) tablet 40 mg (has no administration in time range)  enoxaparin (LOVENOX) injection 40 mg (has no administration in time range)  sodium chloride flush (NS) 0.9 % injection 3 mL (has no administration in time range)  sodium chloride flush (NS) 0.9 % injection 3 mL (has no administration in time range)  0.9 %  sodium chloride infusion (has no administration in time range)  remdesivir 200 mg in sodium chloride 0.9% 250 mL IVPB (0 mg Intravenous Stopped 02/17/19 1932)    Followed by  remdesivir 100 mg in sodium chloride 0.9 % 100 mL IVPB (has no administration in time range)  dexamethasone (DECADRON) tablet 6 mg (has no administration in time range)  acetaminophen (TYLENOL) tablet 650 mg (650 mg Oral Given 02/17/19 1851)  traZODone (DESYREL) tablet 25 mg (has no administration in time range)  dexamethasone (DECADRON) injection 6 mg (6 mg Intravenous Given 02/17/19 1825)    ED Course  I have reviewed the triage vital signs and the nursing notes.  Pertinent labs & imaging results that were available during my care of the patient were  reviewed by me and considered in my medical decision making (  see chart for details).  Clinical Course as of Feb 17 2203  Sat Feb 17, 2019  1443 Differential includes Covid, bacterial pneumonia, PE, anemia, metabolic derangement.Chest x-ray showing some infiltrates on the right side.   [MB]  [id number] Patient desatted to 86% with exertion here.  Have ordered him Decadron.  He would benefit from being inpatient so we can get the rest of the treatment.  He is agreeable to plan.   [MB]  68 Discussed with [facility] hospitalist Dr. Linda Hedges who will evaluate the patient for admission.   [MB]    Clinical Course User Index [MB] Hayden Rasmussen, MD   MDM Rules/Calculators/A&P                     PARTICK FIOLA was evaluated in Emergency Department on 02/17/2019 for the symptoms described in the history of present illness. He was evaluated in the context of the global COVID-19 pandemic, which necessitated consideration that the patient might be at risk for infection with the SARS-CoV-2 virus that causes COVID-19. Institutional protocols and algorithms that pertain to the evaluation of patients at risk for COVID-19 are in a state of rapid change based on information released by regulatory bodies including the CDC and federal and state organizations. These policies and algorithms were followed during the patient's care in the ED.  Final Clinical Impression(s) / ED Diagnoses Final diagnoses:  Acute respiratory failure with hypoxemia ([place])  Pneumonia due to COVID-19 virus    Rx / DC Orders ED Discharge Orders    None       Hayden Rasmussen, MD 02/17/19 2205

## 2019-02-17 NOTE — H&P (Signed)
History and Physical    Stephen Dorsey [id number] DOB: 1960/04/10 DOA: 02/17/2019  PCP: Vernie Shanks, MD (Confirm with patient/family/NH records and if not entered, this has to be entered at [facility] point of entry) Patient coming from: home  I have personally briefly reviewed patient's old medical records in [place]  Chief Complaint: increasing shortness of breath.  HPI: Stephen Dorsey is a 59 y.o. male with medical history significant of positive stress thallium several years ago but has been generally healthy. He had a positive covid test and on Feb 4th received bamlanivimid as an outpatient. Since then he has had increasing shortness of breath. He presents to [facility] ED for evaluation. (For level 3, the HPI must include 4+ descriptors: Location, Quality, Severity, Duration, Timing, Context, modifying factors, associated signs/symptoms and/or status of 3+ chronic problems.)  (Please avoid self-populating past medical history here) (The initial 2-3 lines should be focused and good to copy and paste in the HPI section of the daily progress note).  ED Course: Hemodynamically stable in the ED. Lab rvealed glucose of 149, Na 134, LDH 359, Ferritin 1, 769, CRP 15.5, D-dime 1.4, Fibrinogen 746. CXR with patchy infiltrates RLL, RML c/w early viral PNA.  Review of Systems: As per HPI otherwise 10 point review of systems negative.  Unacceptable ROS statements: "10 systems reviewed," "Extensive" (without elaboration).  Acceptable ROS statements: "All others negative," "All others reviewed and are negative," and "All others unremarkable," with at [place] documented Can't double dip - if using for HPI can't use for ROS  Past Medical History:  Diagnosis Date  . Chest pain    Occasional CP  . Colon polyps    By colonoscopy in 2009   . Decreased pedal pulses   . Dermatitis   . Edema   . Elevated fasting glucose   . Elevated glucose   . Hypertension   . Idiopathic small fiber sensory  neuropathy 2008   Dx in 2008. Getting worse, has trouble feeling his feet. No Sx's in arms.  . Kidney stone 1998  . Peripheral neuropathy     Past Surgical History:  Procedure Laterality Date  . KIDNEY STONE SURGERY     Soc Hx - married, wife is an Armed forces operational officer GI. They have two grown daughters, out of the house. 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    reports that he has never smoked. He has never used smokeless tobacco. He reports current alcohol use of about 2.0 standard drinks of alcohol per week. He reports that he does not use drugs.  No Known Allergies  Family History  Problem Relation Age of Onset  . Thyroid disease Mother        thyroid problems  . Lung cancer Father   . Thyroid disease Sister   . Diabetes Maternal Grandfather    Unacceptable: Noncontributory, unremarkable, or negative. Acceptable: Family history reviewed and not pertinent (If you reviewed it)  Prior to Admission medications   Medication Sig Start Date End Date Taking? Authorizing Provider  Ascorbic Acid (VITAMIN C PO) Take 1 tablet by mouth daily.   Yes [provider]  b complex vitamins tablet Take 1 tablet by mouth daily.   Yes [provider]  Cyanocobalamin (VITAMIN B-12) 5000 MCG TBDP Take 1 tablet by mouth daily.    Yes [provider]  folic acid (FOLVITE) Q000111Q MCG tablet Take 400 mcg by mouth daily.  Yes [provider]  lisinopril (ZESTRIL) 20 MG tablet Take 40 mg by mouth daily. 02/08/19  Yes [provider]  Magnesium 500 MG CAPS Take 500 mg by mouth daily.    Yes [provider]  Omega-3 Fatty Acids (FISH OIL PO) Take 1,400 mg by mouth daily.   Yes [provider]  Potassium 99 MG TABS Take 99 mg by mouth daily. Daily    Yes [provider]  VITAMIN D, CHOLECALCIFEROL, PO Take 1 tablet by mouth daily.    Yes [provider]    Physical Exam: Vitals:    02/17/19 1430 02/17/19 1445 02/17/19 1454 02/17/19 1458  BP: 115/75 120/69    Pulse: 87 82 78 83  Resp: 17 14  19   Temp:      TempSrc:      SpO2: 93% 92% (!) 88% 94%  Weight:      Height:        Constitutional: NAD, calm, comfortable Vitals:   02/17/19 1430 02/17/19 1445 02/17/19 1454 02/17/19 1458  BP: 115/75 120/69    Pulse: 87 82 78 83  Resp: 17 14  19   Temp:      TempSrc:      SpO2: 93% 92% (!) 88% 94%  Weight:      Height:       General: WNWD man in no distress Eyes: PERRL, lids and conjunctivae normal ENMT: Mucous membranes are moist. Posterior pharynx clear of any exudate or lesions.Normal dentition.  Neck: normal, supple, no masses, no thyromegaly Respiratory:  no wheezing, crackles ar right base. Normal respiratory effort. No accessory muscle use.  Cardiovascular: Regular rate and rhythm, no murmurs / rubs / gallops. No extremity edema. 2+ pedal pulses. No carotid bruits.  Abdomen: no tenderness, no masses palpated. No hepatosplenomegaly. Bowel sounds positive.  Musculoskeletal: no clubbing / cyanosis. No joint deformity upper and lower extremities. Good ROM, no contractures. Normal muscle tone.  Skin: no rashes, lesions, ulcers. No induration Neurologic: CN 2-12 grossly intact. Sensation intact, DTR normal. Strength 5/5 in all 4.  Psychiatric: Normal judgment and insight. Alert and oriented x 3. Normal mood.   (Anything < 9 systems with 2 bullets each down codes to level 1) (If patient refuses exam can't bill higher level) (Make sure to document decubitus ulcers present on admission -- if possible -- and whether patient has chronic indwelling catheter at time of admission)  Labs on Admission: I have personally reviewed following labs and imaging studies  CBC: Recent Labs  Lab 02/17/19 1427  WBC 6.8  NEUTROABS 5.2  HGB 14.1  HCT 42.0  MCV 88.1  PLT 0000000   Basic Metabolic Panel: Recent Labs  Lab 02/17/19 1427  NA 134*  K 4.1  CL 99  CO2 24  GLUCOSE  169*  BUN 14  CREATININE 1.15  CALCIUM 8.4*   GFR: Estimated Creatinine Clearance: 94 mL/min (by C-G formula based on SCr of 1.15 mg/dL). Liver Function Tests: Recent Labs  Lab 02/17/19 1427  AST 53*  ALT 72*  ALKPHOS 40  BILITOT 0.6  PROT 6.2*  ALBUMIN 3.0*   No results for input(s): LIPASE, AMYLASE in the last 168 hours. No results for input(s): AMMONIA in the last 168 hours. Coagulation Profile: No results for input(s): INR, PROTIME in the last 168 hours. Cardiac Enzymes: No results for input(s): CKTOTAL, CKMB, CKMBINDEX, TROPONINI in the last 168 hours. BNP (last 3 results) No results for input(s): PROBNP in the last 8760 hours. HbA1C:  No results for input(s): HGBA1C in the last 72 hours. CBG: No results for input(s): GLUCAP in the last 168 hours. Lipid Profile: Recent Labs    02/17/19 1427  TRIG 90   Thyroid Function Tests: No results for input(s): TSH, T4TOTAL, FREET4, T3FREE, THYROIDAB in the last 72 hours. Anemia Panel: Recent Labs    02/17/19 1427  FERRITIN 1,759*   Urine analysis: No results found for: COLORURINE, APPEARANCEUR, LABSPEC, PHURINE, GLUCOSEU, HGBUR, BILIRUBINUR, KETONESUR, PROTEINUR, UROBILINOGEN, NITRITE, LEUKOCYTESUR  Radiological Exams on Admission: DG Chest Port 1 View  Result Date: 02/17/2019 CLINICAL DATA:  59 year old male with fatigue and hypoxia. Recent diagnosis of COVID. EXAM: PORTABLE CHEST 1 VIEW COMPARISON:  None. FINDINGS: Very mild patchy airspace opacity in the right mid and lower lung. The left lung remains relatively clear. The cardiac and mediastinal contours are within normal limits. No pneumothorax or pleural effusion. No acute osseous abnormality. IMPRESSION: Mild patchy airspace opacities in the right mid lung and right lower lobe which could reflect early pneumonia, including COVID pneumonia. Electronically Signed   By: Jacqulynn Cadet M.D.   On: 02/17/2019 14:39    EKG: Independently reviewed. No EKG   Assessment/Plan Active Problems:   Pneumonia due to COVID-19 virus   Type 2 diabetes mellitus without complication ([place])  (please populate well all problems here in Problem List. (For example, if patient is on BP meds at home and you resume or decide to hold them, it is a problem that needs to be her. Same for CAD, COPD, HLD and so on)   1. Covid pneumonia - patient covid positive. S/p bamlanivimid 02/15/19. Now with patchy infiltrates, elevated inflammatory markers and mild hypoxemia with exertion to 86-88% on RA Plan Observation admit to med-surg  Remdesivir - if stable may be able to complete 5 day course as outpatient  Decadron 6 mg daily for 10 days  O2 2 L [place] to keep sats well into 90's  Follow CBC, CMet and CRP  F/u CXR  2. DM - patient endorses being prediabetic.At admission glucose 149 Plan A1C  Glycemic protocol - will hold off on basal insulin or medication.    DVT prophylaxis: lovenox (Lovenox/Heparin/SCD's/anticoagulated/None (if comfort care) Code Status: full code (Full/Partial (specify details) Family Communication: spoke with wife- dx and tx plan reviewed. (Specify name, relationship. Do not write "discussed with patient". Specify tel # if discussed over the phone) Disposition Plan: home 24-48 hrs (specify when and where you expect patient to be discharged) Consults called: none (with names) Admission status: observation (inpatient / obs / tele / medical floor / SDU)   Adella Hare MD [facility] Pager [phone]  If 7PM-7AM, please contact night-coverage [email or web address] Password [email or web address]  02/17/2019, 5:07 PM

## 2019-02-17 NOTE — Progress Notes (Addendum)
SATURATION QUALIFICATIONS: (This note is used to comply with regulatory documentation for home oxygen)  Patient Saturations on Room Air at Rest = 86%  Patient Saturations on Room Air while Ambulating = 86 %  Patient Saturations on 2 Liters of oxygen while Ambulating = 94 %  Please briefly explain why patient needs home oxygen: COVID  Per Flueckiger, Amedeo Kinsman, RN

## 2019-02-17 NOTE — ED Triage Notes (Signed)
Pt tested positive for COVID last week, first outpt infusion done on Thursday. Pt reports fatigue and hypoxia at home 88-92%. States his symptoms haven't worsened but he does not feel well. Pt denies SOB or CP, just overall fatigue. 93% on room air in triage. resp e.u

## 2019-02-17 NOTE — Progress Notes (Signed)
TOC CM referral received for oxygen. Will continue to follow for dc oxygen. Possible admission noted. [place], [place] ED TOC CM [phone]

## 2019-02-17 NOTE — ED Notes (Signed)
Pulse oximetry on room air is 86% with ambulation in room and 93% on room air with rest.

## 2019-02-18 DIAGNOSIS — Z79899 Other long term (current) drug therapy: Secondary | ICD-10-CM | POA: Diagnosis not present

## 2019-02-18 DIAGNOSIS — R0602 Shortness of breath: Secondary | ICD-10-CM | POA: Diagnosis present

## 2019-02-18 DIAGNOSIS — E1142 Type 2 diabetes mellitus with diabetic polyneuropathy: Secondary | ICD-10-CM | POA: Diagnosis present

## 2019-02-18 DIAGNOSIS — Z833 Family history of diabetes mellitus: Secondary | ICD-10-CM | POA: Diagnosis not present

## 2019-02-18 DIAGNOSIS — Z8349 Family history of other endocrine, nutritional and metabolic diseases: Secondary | ICD-10-CM | POA: Diagnosis not present

## 2019-02-18 DIAGNOSIS — I10 Essential (primary) hypertension: Secondary | ICD-10-CM

## 2019-02-18 DIAGNOSIS — E669 Obesity, unspecified: Secondary | ICD-10-CM | POA: Diagnosis present

## 2019-02-18 DIAGNOSIS — E875 Hyperkalemia: Secondary | ICD-10-CM | POA: Diagnosis present

## 2019-02-18 DIAGNOSIS — J1282 Pneumonia due to coronavirus disease 2019: Secondary | ICD-10-CM | POA: Diagnosis present

## 2019-02-18 DIAGNOSIS — J9601 Acute respiratory failure with hypoxia: Secondary | ICD-10-CM

## 2019-02-18 DIAGNOSIS — E119 Type 2 diabetes mellitus without complications: Secondary | ICD-10-CM | POA: Diagnosis not present

## 2019-02-18 DIAGNOSIS — Z6832 Body mass index (BMI) 32.0-32.9, adult: Secondary | ICD-10-CM | POA: Diagnosis not present

## 2019-02-18 DIAGNOSIS — Z801 Family history of malignant neoplasm of trachea, bronchus and lung: Secondary | ICD-10-CM | POA: Diagnosis not present

## 2019-02-18 DIAGNOSIS — Z23 Encounter for immunization: Secondary | ICD-10-CM | POA: Diagnosis not present

## 2019-02-18 DIAGNOSIS — U071 COVID-19: Secondary | ICD-10-CM | POA: Diagnosis present

## 2019-02-18 DIAGNOSIS — Z86010 Personal history of colonic polyps: Secondary | ICD-10-CM | POA: Diagnosis not present

## 2019-02-18 DIAGNOSIS — Z87442 Personal history of urinary calculi: Secondary | ICD-10-CM | POA: Diagnosis not present

## 2019-02-18 LAB — COMPREHENSIVE METABOLIC PANEL
ALT: 70 U/L — ABNORMAL HIGH (ref 0–44)
AST: 43 U/L — ABNORMAL HIGH (ref 15–41)
Albumin: 2.8 g/dL — ABNORMAL LOW (ref 3.5–5.0)
Alkaline Phosphatase: 42 U/L (ref 38–126)
Anion gap: 10 (ref 5–15)
BUN: 14 mg/dL (ref 6–20)
CO2: 21 mmol/L — ABNORMAL LOW (ref 22–32)
Calcium: 8.9 mg/dL (ref 8.9–10.3)
Chloride: 105 mmol/L (ref 98–111)
Creatinine, Ser: 1.02 mg/dL (ref 0.61–1.24)
GFR calc Af Amer: >60 mL/min (ref >60)
GFR calc non Af Amer: >60 mL/min (ref >60)
Glucose, Bld: 227 mg/dL — ABNORMAL HIGH (ref 70–99)
Potassium: 5.6 mmol/L — ABNORMAL HIGH (ref 3.5–5.1)
Sodium: 136 mmol/L (ref 135–145)
Total Bilirubin: 0.5 mg/dL (ref 0.3–1.2)
Total Protein: 6.2 g/dL — ABNORMAL LOW (ref 6.5–8.1)

## 2019-02-18 LAB — C-REACTIVE PROTEIN: CRP: 14.7 mg/dL — ABNORMAL HIGH (ref <1.0)

## 2019-02-18 LAB — CBC WITH DIFFERENTIAL/PLATELET
Abs Immature Granulocytes: 0.03 10*3/uL (ref 0.00–0.07)
Basophils Absolute: 0 10*3/uL (ref 0.0–0.1)
Basophils Relative: 0 %
Eosinophils Absolute: 0 10*3/uL (ref 0.0–0.5)
Eosinophils Relative: 0 %
HCT: 41.2 % (ref 39.0–52.0)
Hemoglobin: 13.7 g/dL (ref 13.0–17.0)
Immature Granulocytes: 1 %
Lymphocytes Relative: 18 %
Lymphs Abs: 0.9 10*3/uL (ref 0.7–4.0)
MCH: 29.7 pg (ref 26.0–34.0)
MCHC: 33.3 g/dL (ref 30.0–36.0)
MCV: 89.2 fL (ref 80.0–100.0)
Monocytes Absolute: 0.2 10*3/uL (ref 0.1–1.0)
Monocytes Relative: 4 %
Neutro Abs: 3.7 10*3/uL (ref 1.7–7.7)
Neutrophils Relative %: 77 %
Platelets: 189 10*3/uL (ref 150–400)
RBC: 4.62 MIL/uL (ref 4.22–5.81)
RDW: 13.9 % (ref 11.5–15.5)
WBC: 4.8 10*3/uL (ref 4.0–10.5)
nRBC: 0 % (ref 0.0–0.2)

## 2019-02-18 MED ORDER — DEXAMETHASONE SODIUM PHOSPHATE 10 MG/ML IJ SOLN
10.0000 mg | INTRAMUSCULAR | Status: DC
  Administered 2019-02-18 – 2019-02-19 (×2): 10 mg via INTRAVENOUS
  Filled 2019-02-18 (×2): qty 1

## 2019-02-18 MED ORDER — SODIUM ZIRCONIUM CYCLOSILICATE 10 G PO PACK
10.0000 g | PACK | Freq: Every day | ORAL | Status: DC
  Administered 2019-02-18 – 2019-02-19 (×2): 10 g via ORAL
  Filled 2019-02-18 (×2): qty 1

## 2019-02-18 NOTE — Progress Notes (Addendum)
PROGRESS NOTE                                                                                                                                                                                                             Patient Demographics:    Stephen Dorsey, is a 59 y.o. male, DOB - [birth year], [id number]  Outpatient Primary MD for the patient is Vernie Shanks, MD   Admit date - 02/17/2019   LOS - 0  Chief Complaint  Patient presents with  . COVID POS  . Fatigue       Brief Narrative: Patient is a 59 y.o. male with PMHx of HTN, prediabetes-known Covid positive since 2/4-received monoclonal antibody as an outpatient-presented with worsening shortness of breath, found to have acute hypoxic respiratory failure requiring 2 L of oxygen secondary to COVID-19 pneumonia.   Subjective:    Stephen Dorsey today feels much better-he was titrated down to 1 L of oxygen this morning he continues to have coughing spells.   Assessment  & Plan :   Acute Hypoxic Resp Failure due to Covid 19 Viral pneumonia: Improving-down to just 1 L of oxygen this morning-I have asked nurse to see if we can titrate this down to room air over the next few hours.  Although improved-CRP still significantly elevated-and needs further inpatient monitoring-as is at risk for severe disease.  Continue steroids and remdesivir-if clinical improvement continues-and CRP downtrend further-we can consider further remdesivir to be given in the outpatient infusion center-we will reassess tomorrow.  Have encouraged patient to continue to use incentive spirometry-he will ambulate-to see how he does.  Fever: afebrile  O2 requirements:  SpO2: 93 % O2 Flow Rate (L/min): 2 L/min   COVID-19 Labs: Recent Labs    02/17/19 1427 02/18/19 0357  DDIMER 1.40*  --   FERRITIN 1,759*  --   LDH 359*  --   CRP 15.5* 14.7*    No results found for: BNP  Recent Labs  Lab  02/17/19 1427  PROCALCITON <0.10    No results found for: SARSCOV2NAA   COVID-19 Medications: Steroids:2/6>> Remdesivir:2/6>> bamlanivimab infusion: 2/4 x 1 at [facility]  Prone/Incentive Spirometry: encouraged incentive spirometry use 3-4/hour.  DVT Prophylaxis  :  Lovenox   Mild hyperkalemia: Start Lokelma-repeat electrolytes tomorrow.  Transaminitis: Appears to be mild-likely secondary to COVID-19-supportive care-monitor closely  as patient on IV remdesivir.  HTN: Controlled-continue lisinopril  Obesity: Estimated body mass index is 32.12 kg/m as calculated from the following:   Height as of this encounter: 6\' 3"  (1.905 m).   Weight as of this encounter: 116.6 kg.   Consults  :  None  Procedures  :  None  ABG: No results found for: PHART, PCO2ART, PO2ART, HCO3, TCO2, ACIDBASEDEF, O2SAT  Vent Settings: N/A  Condition - Stable  Family Communication  :  Spouse updated over the phone  Code Status :  Full Code  Diet :  Diet Order            Diet Carb Modified Fluid consistency: Thin; Room service appropriate? Yes  Diet effective now               Disposition Plan  :  Remain hospitalized  Barriers to discharge: Hypoxia requiring O2 supplementation/complete 5 days of IV Remdesivir  Antimicorbials  :    Anti-infectives (From admission, onward)   Start     Dose/Rate Route Frequency Ordered Stop   02/18/19 1000  remdesivir 100 mg in sodium chloride 0.9 % 100 mL IVPB     100 mg 200 mL/hr over 30 Minutes Intravenous Daily 02/17/19 1705 02/22/19 0959   02/17/19 1715  remdesivir 200 mg in sodium chloride 0.9% 250 mL IVPB     200 mg 580 mL/hr over 30 Minutes Intravenous Once 02/17/19 1705 02/17/19 1932      Inpatient Medications  Scheduled Meds: . dexamethasone  6 mg Oral Q24H  . enoxaparin (LOVENOX) injection  40 mg Subcutaneous Q24H  . lisinopril  40 mg Oral Daily  . sodium chloride flush  3 mL Intravenous Q12H  . sodium zirconium cyclosilicate  10 g Oral  Daily   Continuous Infusions: . sodium chloride    . remdesivir 100 mg in NS 100 mL 100 mg (02/18/19 0842)   PRN Meds:.sodium chloride, acetaminophen, albuterol, alum & mag hydroxide-simeth, sodium chloride flush, traZODone   Time Spent in minutes  25  See all Orders from today for further details   Oren Binet M.D on 02/18/2019 at 11:45 AM  To page go to [email or web address] - use universal password  [facility] -  Office  [phone]    Objective:   Vitals:   02/17/19 1828 02/17/19 2112 02/18/19 0537 02/18/19 0841  BP: 137/80 (!) 159/83 (!) 101/58 (!) 166/80  Pulse: 83 85 69   Resp: 18 (!) 24 18   Temp: 99.7 F (37.6 C) 98.2 F (36.8 C) 97.6 F (36.4 C)   TempSrc: Oral Oral Oral   SpO2: 96% 99% 93%   Weight:      Height:        Wt Readings from Last 3 Encounters:  02/17/19 116.6 kg  10/05/17 111.4 kg  03/31/16 120.7 kg     Intake/Output Summary (Last 24 hours) at 02/18/2019 1145 Last data filed at 02/17/2019 1932 Gross per 24 hour  Intake 250 ml  Output --  Net 250 ml     Physical Exam Gen Exam:Alert awake-not in any distress HEENT:atraumatic, normocephalic Chest: B/L clear to auscultation anteriorly CVS:S1S2 regular Abdomen:soft non tender, non distended Extremities:no edema Neurology: Non focal Skin: no rash   Data Review:    CBC Recent Labs  Lab 02/17/19 1427 02/18/19 0357  WBC 6.8 4.8  HGB 14.1 13.7  HCT 42.0 41.2  PLT 162 189  MCV 88.1 89.2  MCH 29.6 29.7  MCHC 33.6 33.3  RDW 14.0  13.9  LYMPHSABS 1.2 0.9  MONOABS 0.5 0.2  EOSABS 0.0 0.0  BASOSABS 0.0 0.0    Chemistries  Recent Labs  Lab 02/17/19 1427 02/18/19 0357  NA 134* 136  K 4.1 5.6*  CL 99 105  CO2 24 21*  GLUCOSE 169* 227*  BUN 14 14  CREATININE 1.15 1.02  CALCIUM 8.4* 8.9  AST 53* 43*  ALT 72* 70*  ALKPHOS 40 42  BILITOT 0.6 0.5   ------------------------------------------------------------------------------------------------------------------ Recent  Labs    02/17/19 1427  TRIG 90    Lab Results  Component Value Date   HGBA1C 6.3 (H) 02/17/2019   ------------------------------------------------------------------------------------------------------------------ No results for input(s): TSH, T4TOTAL, T3FREE, THYROIDAB in the last 72 hours.  Invalid input(s): FREET3 ------------------------------------------------------------------------------------------------------------------ Recent Labs    02/17/19 1427  FERRITIN 1,759*    Coagulation profile No results for input(s): INR, PROTIME in the last 168 hours.  Recent Labs    02/17/19 1427  DDIMER 1.40*    Cardiac Enzymes No results for input(s): CKMB, TROPONINI, MYOGLOBIN in the last 168 hours.  Invalid input(s): CK ------------------------------------------------------------------------------------------------------------------ No results found for: BNP  Micro Results Recent Results (from the past 240 hour(s))  Blood Culture (routine x 2)     Status: None (Preliminary result)   Collection Time: 02/17/19  2:22 PM   Specimen: BLOOD  Result Value Ref Range Status   Specimen Description BLOOD RIGHT ANTECUBITAL  Final   Special Requests   Final    BOTTLES DRAWN AEROBIC AND ANAEROBIC Blood Culture results may not be optimal due to an inadequate volume of blood received in culture bottles Performed at [facility] [address], [place], [place] [address]    Culture NO GROWTH < 24 HOURS  Final   Report Status PENDING  Incomplete  Blood Culture (routine x 2)     Status: None (Preliminary result)   Collection Time: 02/17/19  4:21 PM   Specimen: BLOOD LEFT FOREARM  Result Value Ref Range Status   Specimen Description BLOOD LEFT FOREARM  Final   Special Requests   Final    BOTTLES DRAWN AEROBIC AND ANAEROBIC Blood Culture adequate volume Performed at [facility], [place] [address] [place], [place] [address]    Culture NO GROWTH < 24 HOURS  Final   Report  Status PENDING  Incomplete    Radiology Reports DG Chest Port 1 View  Result Date: 02/17/2019 CLINICAL DATA:  59 year old male with fatigue and hypoxia. Recent diagnosis of COVID. EXAM: PORTABLE CHEST 1 VIEW COMPARISON:  None. FINDINGS: Very mild patchy airspace opacity in the right mid and lower lung. The left lung remains relatively clear. The cardiac and mediastinal contours are within normal limits. No pneumothorax or pleural effusion. No acute osseous abnormality. IMPRESSION: Mild patchy airspace opacities in the right mid lung and right lower lobe which could reflect early pneumonia, including COVID pneumonia. Electronically Signed   By: Jacqulynn Cadet M.D.   On: 02/17/2019 14:39

## 2019-02-19 LAB — D-DIMER, QUANTITATIVE: D-Dimer, Quant: 0.65 ug/mL-FEU — ABNORMAL HIGH (ref 0.00–0.50)

## 2019-02-19 LAB — CBC WITH DIFFERENTIAL/PLATELET
Abs Immature Granulocytes: 0.09 10*3/uL — ABNORMAL HIGH (ref 0.00–0.07)
Basophils Absolute: 0 10*3/uL (ref 0.0–0.1)
Basophils Relative: 0 %
Eosinophils Absolute: 0 10*3/uL (ref 0.0–0.5)
Eosinophils Relative: 0 %
HCT: 38.4 % — ABNORMAL LOW (ref 39.0–52.0)
Hemoglobin: 12.8 g/dL — ABNORMAL LOW (ref 13.0–17.0)
Immature Granulocytes: 1 %
Lymphocytes Relative: 8 %
Lymphs Abs: 1.2 10*3/uL (ref 0.7–4.0)
MCH: 29.6 pg (ref 26.0–34.0)
MCHC: 33.3 g/dL (ref 30.0–36.0)
MCV: 88.7 fL (ref 80.0–100.0)
Monocytes Absolute: 0.8 10*3/uL (ref 0.1–1.0)
Monocytes Relative: 5 %
Neutro Abs: 13.2 10*3/uL — ABNORMAL HIGH (ref 1.7–7.7)
Neutrophils Relative %: 86 %
Platelets: 235 10*3/uL (ref 150–400)
RBC: 4.33 MIL/uL (ref 4.22–5.81)
RDW: 13.7 % (ref 11.5–15.5)
WBC: 15.4 10*3/uL — ABNORMAL HIGH (ref 4.0–10.5)
nRBC: 0 % (ref 0.0–0.2)

## 2019-02-19 LAB — COMPREHENSIVE METABOLIC PANEL
ALT: 63 U/L — ABNORMAL HIGH (ref 0–44)
AST: 29 U/L (ref 15–41)
Albumin: 2.8 g/dL — ABNORMAL LOW (ref 3.5–5.0)
Alkaline Phosphatase: 40 U/L (ref 38–126)
Anion gap: 15 (ref 5–15)
BUN: 22 mg/dL — ABNORMAL HIGH (ref 6–20)
CO2: 25 mmol/L (ref 22–32)
Calcium: 9.7 mg/dL (ref 8.9–10.3)
Chloride: 99 mmol/L (ref 98–111)
Creatinine, Ser: 1.2 mg/dL (ref 0.61–1.24)
GFR calc Af Amer: >60 mL/min (ref >60)
GFR calc non Af Amer: >60 mL/min (ref >60)
Glucose, Bld: 219 mg/dL — ABNORMAL HIGH (ref 70–99)
Potassium: 4.5 mmol/L (ref 3.5–5.1)
Sodium: 139 mmol/L (ref 135–145)
Total Bilirubin: 0.8 mg/dL (ref 0.3–1.2)
Total Protein: 5.8 g/dL — ABNORMAL LOW (ref 6.5–8.1)

## 2019-02-19 LAB — C-REACTIVE PROTEIN: CRP: 8.1 mg/dL — ABNORMAL HIGH (ref <1.0)

## 2019-02-19 MED ORDER — DEXAMETHASONE 6 MG PO TABS: 6.0000 mg | ORAL_TABLET | Freq: Every day | ORAL | 0 refills | Status: DC

## 2019-02-19 NOTE — Progress Notes (Signed)
Pt given discharge instructions, prescriptions, and care notes. Pt verbalized understanding AEB no further questions or concerns at this time. IV was discontinued, no redness, pain, or swelling noted at this time. Telemetry discontinued and Centralized Telemetry was notified. Pt left the floor with staff in stable condition. 

## 2019-02-19 NOTE — Discharge Instructions (Addendum)
You are scheduled for an outpatient infusion of Remdesivir at 10:00 AM on Tuesday 2/9 and Wednesday 2/10.[name]  Please report to [name] at [address].  Drive to the security guard and tell them you are here for an infusion. They will direct you to the front entrance where we will come and get you.  For questions call [phone].  Thanks       Person Under Monitoring Name: Stephen Dorsey  Location: [address] [place] [address]   Infection Prevention Recommendations for Individuals Confirmed to have, or Being Evaluated for, 2019 Novel Coronavirus (COVID-19) Infection Who Receive Care at Home  Individuals who are confirmed to have, or are being evaluated for, COVID-19 should follow the prevention steps below until a healthcare provider or local or state health department says they can return to normal activities.  Stay home except to get medical care You should restrict activities outside your home, except for getting medical care. Do not go to work, school, or public areas, and do not use public transportation or taxis.  Call ahead before visiting your doctor Before your medical appointment, call the healthcare provider and tell them that you have, or are being evaluated for, COVID-19 infection. This will help the healthcare provider's office take steps to keep other people from getting infected. Ask your healthcare provider to call the local or state health department.  Monitor your symptoms Seek prompt medical attention if your illness is worsening (e.g., difficulty breathing). Before going to your medical appointment, call the healthcare provider and tell them that you have, or are being evaluated for, COVID-19 infection. Ask your healthcare provider to call the local or state health department.  Wear a facemask You should wear a facemask that covers your nose and mouth when you are in the same room with other people and when you visit a healthcare  provider. People who live with or visit you should also wear a facemask while they are in the same room with you.  Separate yourself from other people in your home As much as possible, you should stay in a different room from other people in your home. Also, you should use a separate bathroom, if available.  Avoid sharing household items You should not share dishes, drinking glasses, cups, eating utensils, towels, bedding, or other items with other people in your home. After using these items, you should wash them thoroughly with soap and water.  Cover your coughs and sneezes Cover your mouth and nose with a tissue when you cough or sneeze, or you can cough or sneeze into your sleeve. Throw used tissues in a lined trash can, and immediately wash your hands with soap and water for at least 20 seconds or use an alcohol-based hand rub.  Wash your [facility] your hands often and thoroughly with soap and water for at least 20 seconds. You can use an alcohol-based hand sanitizer if soap and water are not available and if your hands are not visibly dirty. Avoid touching your eyes, nose, and mouth with unwashed hands.   Prevention Steps for Caregivers and Household Members of Individuals Confirmed to have, or Being Evaluated for, COVID-19 Infection Being Cared for in the Home  If you live with, or provide care at home for, a person confirmed to have, or being evaluated for, COVID-19 infection please follow these guidelines to prevent infection:  Follow healthcare provider's instructions Make sure that you understand and can help the patient follow any  healthcare provider instructions for all care.  Provide for the patient's basic needs You should help the patient with basic needs in the home and provide support for getting groceries, prescriptions, and other personal needs.  Monitor the patient's symptoms If they are getting sicker, call his or her medical provider and tell them that the  patient has, or is being evaluated for, COVID-19 infection. This will help the healthcare provider's office take steps to keep other people from getting infected. Ask the healthcare provider to call the local or state health department.  Limit the number of people who have contact with the patient  If possible, have only one caregiver for the patient.  Other household members should stay in another home or place of residence. If this is not possible, they should stay  in another room, or be separated from the patient as much as possible. Use a separate bathroom, if available.  Restrict visitors who do not have an essential need to be in the home.  Keep older adults, very young children, and other sick people away from the patient Keep older adults, very young children, and those who have compromised immune systems or chronic health conditions away from the patient. This includes people with chronic heart, lung, or kidney conditions, diabetes, and cancer.  Ensure good ventilation Make sure that shared spaces in the home have good air flow, such as from an air conditioner or an opened window, weather permitting.  Wash your hands often  Wash your hands often and thoroughly with soap and water for at least 20 seconds. You can use an alcohol based hand sanitizer if soap and water are not available and if your hands are not visibly dirty.  Avoid touching your eyes, nose, and mouth with unwashed hands.  Use disposable paper towels to dry your hands. If not available, use dedicated cloth towels and replace them when they become wet.  Wear a facemask and gloves  Wear a disposable facemask at all times in the room and gloves when you touch or have contact with the patient's blood, body fluids, and/or secretions or excretions, such as sweat, saliva, sputum, nasal mucus, vomit, urine, or feces.  Ensure the mask fits over your nose and mouth tightly, and do not touch it during use.  Throw out  disposable facemasks and gloves after using them. Do not reuse.  Wash your hands immediately after removing your facemask and gloves.  If your personal clothing becomes contaminated, carefully remove clothing and launder. Wash your hands after handling contaminated clothing.  Place all used disposable facemasks, gloves, and other waste in a lined container before disposing them with other household waste.  Remove gloves and wash your hands immediately after handling these items.  Do not share dishes, glasses, or other household items with the patient  Avoid sharing household items. You should not share dishes, drinking glasses, cups, eating utensils, towels, bedding, or other items with a patient who is confirmed to have, or being evaluated for, COVID-19 infection.  After the person uses these items, you should wash them thoroughly with soap and water.  Wash laundry thoroughly  Immediately remove and wash clothes or bedding that have blood, body fluids, and/or secretions or excretions, such as sweat, saliva, sputum, nasal mucus, vomit, urine, or feces, on them.  Wear gloves when handling laundry from the patient.  Read and follow directions on labels of laundry or clothing items and detergent. In general, wash and dry with the warmest temperatures recommended on the  label.  Clean all areas the individual has used often  Clean all touchable surfaces, such as counters, tabletops, doorknobs, bathroom fixtures, toilets, phones, keyboards, tablets, and bedside tables, every day. Also, clean any surfaces that may have blood, body fluids, and/or secretions or excretions on them.  Wear gloves when cleaning surfaces the patient has come in contact with.  Use a diluted bleach solution (e.g., dilute bleach with 1 part bleach and 10 parts water) or a household disinfectant with a label that says EPA-registered for coronaviruses. To make a bleach solution at home, add 1 tablespoon of bleach to 1  quart (4 cups) of water. For a larger supply, add  cup of bleach to 1 gallon (16 cups) of water.  Read labels of cleaning products and follow recommendations provided on product labels. Labels contain instructions for safe and effective use of the cleaning product including precautions you should take when applying the product, such as wearing gloves or eye protection and making sure you have good ventilation during use of the product.  Remove gloves and wash hands immediately after cleaning.  Monitor yourself for signs and symptoms of illness Caregivers and household members are considered close contacts, should monitor their health, and will be asked to limit movement outside of the home to the extent possible. Follow the monitoring steps for close contacts listed on the symptom monitoring form.   ? If you have additional questions, contact your local health department or call the epidemiologist on call at [phone] (available 24/7). ? This guidance is subject to change. For the most up-to-date guidance from [facility], please refer to their website: [email or web address].pl

## 2019-02-19 NOTE — Progress Notes (Signed)
Patient scheduled for outpatient Remdesivir infusion at 10:00 AM on Tuesday 2/9 and Wednesday 2/10.  Please advise them to report to [facility] at [address]  Drive to the security guard and tell them you are here for an infusion. They will direct you to the front entrance where we will come and get you.  For questions call [phone].  Thanks

## 2019-02-19 NOTE — Discharge Summary (Addendum)
PATIENT DETAILS Name: Stephen Dorsey Age: 59 y.o. Sex: male Date of Birth: Dec 21, 1960 MRN: [id number]. Admitting Physician: Jonetta Osgood, MD [id number], Edwyna Shell, MD  Admit Date: 02/17/2019 Discharge date: 02/19/2019  Recommendations for Outpatient Follow-up:  1. Follow up with PCP in 1-2 weeks 2. Please obtain CMP/CBC in one week 3. Repeat Chest Xray in 4-6 week  Admitted From:  Home  Disposition: [place]: No  Equipment/Devices: None  Discharge Condition: Stable  CODE STATUS: FULL CODE  Diet recommendation:  Diet Order            Diet - low sodium heart healthy        Diet Carb Modified Fluid consistency: Thin; Room service appropriate? Yes  Diet effective now               Brief Summary: See H&P, Labs, Consult and Test reports for all details in brief, Patient is a 59 y.o. male with PMHx of HTN, prediabetes-known Covid positive since 2/4-received monoclonal antibody as an outpatient-presented with worsening shortness of breath, found to have acute hypoxic respiratory failure requiring 2 L of oxygen secondary to COVID-19 pneumonia.  Brief Hospital Course: Acute Hypoxic Resp Failure due to Covid 19 Viral pneumonia:  Significantly improved-has been titrated to room air since yesterday afternoon.  He feels significantly better-and thinks he is back to his baseline.  No coughing this morning.  Plans are to discharge him today-he will complete remdesivir in the outpatient infusion center on 2/9 and 2/10.  Continue steroids for a few more days-as CRP is downtrending as well.  Have asked him to follow-up with his primary care practitioner for further care.  COVID-19 Labs:  Recent Labs    02/17/19 1427 02/18/19 0357 02/19/19 0257  DDIMER 1.40*  --  0.65*  FERRITIN 1,759*  --   --   LDH 359*  --   --   CRP 15.5* 14.7* 8.1*    No results found for: SARSCOV2NAA   COVID-19 Medications: Steroids:2/6>> Remdesivir:2/6>> bamlanivimab infusion: 2/4  x 1 at [place]  Mild hyperkalemia: Resolved-no need for [facility] on discharge.  Transaminitis: Appears to be mild-likely secondary to COVID-19-monitor closely in the outpatient setting.  HTN: Controlled-continue lisinopril  Obesity: Estimated body mass index is 32.12 kg/m as calculated from the following:   Height as of this encounter: 6\' 3"  (1.905 m).   Weight as of this encounter: 116.6 kg.   Procedures/Studies: None  Discharge Diagnoses:  Active Problems:   Type 2 diabetes mellitus without complication ([place])   Pneumonia due to COVID-19 virus   Discharge Instructions:    Person Under Monitoring Name: Stephen Dorsey  Location: [address] [place] [address]   Infection Prevention Recommendations for Individuals Confirmed to have, or Being Evaluated for, 2019 Novel Coronavirus (COVID-19) Infection Who Receive Care at Home  Individuals who are confirmed to have, or are being evaluated for, COVID-19 should follow the prevention steps below until a healthcare provider or local or state health department says they can return to normal activities.  Stay home except to get medical care You should restrict activities outside your home, except for getting medical care. Do not go to work, school, or public areas, and do not use public transportation or taxis.  Call ahead before visiting your doctor Before your medical appointment, call the healthcare provider and tell them that you have, or are being evaluated for, COVID-19 infection. This will help the healthcare provider's office take steps  to keep other people from getting infected. Ask your healthcare provider to call the local or state health department.  Monitor your symptoms Seek prompt medical attention if your illness is worsening (e.g., difficulty breathing). Before going to your medical appointment, call the healthcare provider and tell them that you have, or are being evaluated for, COVID-19 infection.  Ask your healthcare provider to call the local or state health department.  Wear a facemask You should wear a facemask that covers your nose and mouth when you are in the same room with other people and when you visit a healthcare provider. People who live with or visit you should also wear a facemask while they are in the same room with you.  Separate yourself from other people in your home As much as possible, you should stay in a different room from other people in your home. Also, you should use a separate bathroom, if available.  Avoid sharing household items You should not share dishes, drinking glasses, cups, eating utensils, towels, bedding, or other items with other people in your home. After using these items, you should wash them thoroughly with soap and water.  Cover your coughs and sneezes Cover your mouth and nose with a tissue when you cough or sneeze, or you can cough or sneeze into your sleeve. Throw used tissues in a lined trash can, and immediately wash your hands with soap and water for at least 20 seconds or use an alcohol-based hand rub.  Wash your [facility] your hands often and thoroughly with soap and water for at least 20 seconds. You can use an alcohol-based hand sanitizer if soap and water are not available and if your hands are not visibly dirty. Avoid touching your eyes, nose, and mouth with unwashed hands.   Prevention Steps for Caregivers and Household Members of Individuals Confirmed to have, or Being Evaluated for, COVID-19 Infection Being Cared for in the Home  If you live with, or provide care at home for, a person confirmed to have, or being evaluated for, COVID-19 infection please follow these guidelines to prevent infection:  Follow healthcare provider's instructions Make sure that you understand and can help the patient follow any healthcare provider instructions for all care.  Provide for the patient's basic needs You should help the  patient with basic needs in the home and provide support for getting groceries, prescriptions, and other personal needs.  Monitor the patient's symptoms If they are getting sicker, call his or her medical provider and tell them that the patient has, or is being evaluated for, COVID-19 infection. This will help the healthcare provider's office take steps to keep other people from getting infected. Ask the healthcare provider to call the local or state health department.  Limit the number of people who have contact with the patient  If possible, have only one caregiver for the patient.  Other household members should stay in another home or place of residence. If this is not possible, they should stay  in another room, or be separated from the patient as much as possible. Use a separate bathroom, if available.  Restrict visitors who do not have an essential need to be in the home.  Keep older adults, very young children, and other sick people away from the patient Keep older adults, very young children, and those who have compromised immune systems or chronic health conditions away from the patient. This includes people with chronic heart, lung, or kidney conditions, diabetes, and cancer.  Ensure  good ventilation Make sure that shared spaces in the home have good air flow, such as from an air conditioner or an opened window, weather permitting.  Wash your hands often  Wash your hands often and thoroughly with soap and water for at least 20 seconds. You can use an alcohol based hand sanitizer if soap and water are not available and if your hands are not visibly dirty.  Avoid touching your eyes, nose, and mouth with unwashed hands.  Use disposable paper towels to dry your hands. If not available, use dedicated cloth towels and replace them when they become wet.  Wear a facemask and gloves  Wear a disposable facemask at all times in the room and gloves when you touch or have contact  with the patient's blood, body fluids, and/or secretions or excretions, such as sweat, saliva, sputum, nasal mucus, vomit, urine, or feces.  Ensure the mask fits over your nose and mouth tightly, and do not touch it during use.  Throw out disposable facemasks and gloves after using them. Do not reuse.  Wash your hands immediately after removing your facemask and gloves.  If your personal clothing becomes contaminated, carefully remove clothing and launder. Wash your hands after handling contaminated clothing.  Place all used disposable facemasks, gloves, and other waste in a lined container before disposing them with other household waste.  Remove gloves and wash your hands immediately after handling these items.  Do not share dishes, glasses, or other household items with the patient  Avoid sharing household items. You should not share dishes, drinking glasses, cups, eating utensils, towels, bedding, or other items with a patient who is confirmed to have, or being evaluated for, COVID-19 infection.  After the person uses these items, you should wash them thoroughly with soap and water.  Wash laundry thoroughly  Immediately remove and wash clothes or bedding that have blood, body fluids, and/or secretions or excretions, such as sweat, saliva, sputum, nasal mucus, vomit, urine, or feces, on them.  Wear gloves when handling laundry from the patient.  Read and follow directions on labels of laundry or clothing items and detergent. In general, wash and dry with the warmest temperatures recommended on the label.  Clean all areas the individual has used often  Clean all touchable surfaces, such as counters, tabletops, doorknobs, bathroom fixtures, toilets, phones, keyboards, tablets, and bedside tables, every day. Also, clean any surfaces that may have blood, body fluids, and/or secretions or excretions on them.  Wear gloves when cleaning surfaces the patient has come in contact with.  Use  a diluted bleach solution (e.g., dilute bleach with 1 part bleach and 10 parts water) or a household disinfectant with a label that says EPA-registered for coronaviruses. To make a bleach solution at home, add 1 tablespoon of bleach to 1 quart (4 cups) of water. For a larger supply, add  cup of bleach to 1 gallon (16 cups) of water.  Read labels of cleaning products and follow recommendations provided on product labels. Labels contain instructions for safe and effective use of the cleaning product including precautions you should take when applying the product, such as wearing gloves or eye protection and making sure you have good ventilation during use of the product.  Remove gloves and wash hands immediately after cleaning.  Monitor yourself for signs and symptoms of illness Caregivers and household members are considered close contacts, should monitor their health, and will be asked to limit movement outside of the home to the extent possible.  Follow the monitoring steps for close contacts listed on the symptom monitoring form.   ? If you have additional questions, contact your local health department or call the epidemiologist on call at [phone] (available 24/7). ? This guidance is subject to change. For the most up-to-date guidance from [facility], please refer to their website: YouBlogs.pl    Activity:  As tolerated   Discharge Instructions    Call MD for:  difficulty breathing, headache or visual disturbances   Complete by: As directed    Call MD for:  extreme fatigue   Complete by: As directed    Call MD for:  persistant dizziness or light-headedness   Complete by: As directed    Diet - low sodium heart healthy   Complete by: As directed    Discharge instructions   Complete by: As directed    1) 3 weeks of isolation from your first positive Covid test (on 1/29-Per patient report)  2) You have been scheduled for  outpatient Remdesivir infusion at 10:00 AM on Tuesday 2/9 and Wednesday 2/10.  Please advise them to report to [facility] at [address]  Drive to the security guard and tell them you are here for an infusion. They will direct you to the front entrance where we will come and get you.  For questions call [phone].   Follow with Primary MD  Vernie Shanks, MD in 1-2 weeks  Please get a complete blood count and chemistry panel checked by your Primary MD at your next visit, and again as instructed by your Primary MD.  Get Medicines reviewed and adjusted: Please take all your medications with you for your next visit with your Primary MD  Laboratory/radiological data: Please request your Primary MD to go over all hospital tests and procedure/radiological results at the follow up, please ask your Primary MD to get all Hospital records sent to his/her office.  In some cases, they will be blood work, cultures and biopsy results pending at the time of your discharge. Please request that your primary care M.D. follows up on these results.  Also Note the following: If you experience worsening of your admission symptoms, develop shortness of breath, life threatening emergency, suicidal or homicidal thoughts you must seek medical attention immediately by calling 911 or calling your MD immediately  if symptoms less severe.  You must read complete instructions/literature along with all the possible adverse reactions/side effects for all the Medicines you take and that have been prescribed to you. Take any new Medicines after you have completely understood and accpet all the possible adverse reactions/side effects.   Do not drive when taking Pain medications or sleeping medications (Benzodaizepines)  Do not take more than prescribed Pain, Sleep and Anxiety Medications. It is not advisable to combine anxiety,sleep and pain medications without talking with your primary care  practitioner  Special Instructions: If you have smoked or chewed Tobacco  in the last 2 yrs please stop smoking, stop any regular Alcohol  and or any Recreational drug use.  Wear Seat belts while driving.  Please note: You were cared for by a hospitalist during your hospital stay. Once you are discharged, your primary care physician will handle any further medical issues. Please note that NO REFILLS for any discharge medications will be authorized once you are discharged, as it is imperative that you return to your primary care physician (or establish a relationship with a primary care physician if you do not have one) for your  post hospital discharge needs so that they can reassess your need for medications and monitor your lab values.   Increase activity slowly   Complete by: As directed      Allergies as of 02/19/2019   No Known Allergies     Medication List    TAKE these medications   b complex vitamins tablet Take 1 tablet by mouth daily.   dexamethasone 6 MG tablet Commonly known as: DECADRON Take 1 tablet (6 mg total) by mouth daily.   FISH OIL PO Take 1,400 mg by mouth daily.   folic acid [id number] MCG tablet Commonly known as: FOLVITE Take 400 mcg by mouth daily.   lisinopril 20 MG tablet Commonly known as: ZESTRIL Take 40 mg by mouth daily.   Magnesium 500 MG Caps Take 500 mg by mouth daily.   Potassium 99 MG Tabs Take 99 mg by mouth daily. Daily   Vitamin B-12 5000 MCG Tbdp Take 1 tablet by mouth daily.   VITAMIN C PO Take 1 tablet by mouth daily.   VITAMIN D (CHOLECALCIFEROL) PO Take 1 tablet by mouth daily.      Follow-up Information    Vernie Shanks, MD. Schedule an appointment as soon as possible for a visit in 1 week(s).   Specialty: Family Medicine Contact information: [place] [address] [phone]          No Known Allergies  Consultations:   None   Other Procedures/Studies: DG Chest Port 1 View  Result  Date: 02/17/2019 CLINICAL DATA:  59 year old male with fatigue and hypoxia. Recent diagnosis of COVID. EXAM: PORTABLE CHEST 1 VIEW COMPARISON:  None. FINDINGS: Very mild patchy airspace opacity in the right mid and lower lung. The left lung remains relatively clear. The cardiac and mediastinal contours are within normal limits. No pneumothorax or pleural effusion. No acute osseous abnormality. IMPRESSION: Mild patchy airspace opacities in the right mid lung and right lower lobe which could reflect early pneumonia, including COVID pneumonia. Electronically Signed   By: Jacqulynn Cadet M.D.   On: 02/17/2019 14:39     TODAY-DAY OF DISCHARGE:  Subjective:   Stephen Dorsey today has no headache,no chest abdominal pain,no new weakness tingling or numbness, feels much better wants to go home today.   Objective:   Blood pressure (!) 158/96, pulse 79, temperature 97.7 F (36.5 C), temperature source Oral, resp. rate 19, height 6\' 3"  (1.905 m), weight 116.6 kg, SpO2 95 %.  Intake/Output Summary (Last 24 hours) at 02/19/2019 1015 Last data filed at 02/18/2019 2000 Gross per 24 hour  Intake 333 ml  Output --  Net 333 ml   Filed Weights   02/17/19 1341  Weight: 116.6 kg    Exam: Awake Alert, Oriented *3, No new F.N deficits, Normal affect Okay.AT,PERRAL Supple Neck,No JVD, No cervical lymphadenopathy appriciated.  Symmetrical Chest wall movement, Good air movement bilaterally, CTAB RRR,No Gallops,Rubs or new Murmurs, No Parasternal Heave +ve B.Sounds, Abd Soft, Non tender, No organomegaly appriciated, No rebound -guarding or rigidity. No Cyanosis, Clubbing or edema, No new Rash or bruise   PERTINENT RADIOLOGIC STUDIES: DG Chest Port 1 View  Result Date: 02/17/2019 CLINICAL DATA:  59 year old male with fatigue and hypoxia. Recent diagnosis of COVID. EXAM: PORTABLE CHEST 1 VIEW COMPARISON:  None. FINDINGS: Very mild patchy airspace opacity in the right mid and lower lung. The left lung remains  relatively clear. The cardiac and mediastinal contours are within normal limits. No pneumothorax or pleural effusion. No  acute osseous abnormality. IMPRESSION: Mild patchy airspace opacities in the right mid lung and right lower lobe which could reflect early pneumonia, including COVID pneumonia. Electronically Signed   By: Jacqulynn Cadet M.D.   On: 02/17/2019 14:39     PERTINENT LAB RESULTS: CBC: Recent Labs    02/18/19 0357 02/19/19 0257  WBC 4.8 15.4*  HGB 13.7 12.8*  HCT 41.2 38.4*  PLT 189 235   CMET CMP     Component Value Date/Time   NA 139 02/19/2019 0257   K 4.5 02/19/2019 0257   CL 99 02/19/2019 0257   CO2 25 02/19/2019 0257   GLUCOSE 219 (H) 02/19/2019 0257   BUN 22 (H) 02/19/2019 0257   CREATININE 1.20 02/19/2019 0257   CALCIUM 9.7 02/19/2019 0257   PROT 5.8 (L) 02/19/2019 0257   PROT 6.6 02/28/2013 1423   ALBUMIN 2.8 (L) 02/19/2019 0257   AST 29 02/19/2019 0257   ALT 63 (H) 02/19/2019 0257   ALKPHOS 40 02/19/2019 0257   BILITOT 0.8 02/19/2019 0257   GFRNONAA >60 02/19/2019 0257   GFRAA >60 02/19/2019 0257    GFR Estimated Creatinine Clearance: 90.1 mL/min (by C-G formula based on SCr of 1.2 mg/dL). No results for input(s): LIPASE, AMYLASE in the last 72 hours. No results for input(s): CKTOTAL, CKMB, CKMBINDEX, TROPONINI in the last 72 hours. Invalid input(s): POCBNP Recent Labs    02/17/19 1427 02/19/19 0257  DDIMER 1.40* 0.65*   Recent Labs    02/17/19 1427  HGBA1C 6.3*   Recent Labs    02/17/19 1427  TRIG 90   No results for input(s): TSH, T4TOTAL, T3FREE, THYROIDAB in the last 72 hours.  Invalid input(s): FREET3 Recent Labs    02/17/19 1427  FERRITIN 1,759*   Coags: No results for input(s): INR in the last 72 hours.  Invalid input(s): PT Microbiology: Recent Results (from the past 240 hour(s))  Blood Culture (routine x 2)     Status: None (Preliminary result)   Collection Time: 02/17/19  2:22 PM   Specimen: BLOOD  Result  Value Ref Range Status   Specimen Description BLOOD RIGHT ANTECUBITAL  Final   Special Requests   Final    BOTTLES DRAWN AEROBIC AND ANAEROBIC Blood Culture results may not be optimal due to an inadequate volume of blood received in culture bottles Performed at [facility] [address] [place], [place] [address]    Culture NO GROWTH < 24 HOURS  Final   Report Status PENDING  Incomplete  Blood Culture (routine x 2)     Status: None (Preliminary result)   Collection Time: 02/17/19  4:21 PM   Specimen: BLOOD LEFT FOREARM  Result Value Ref Range Status   Specimen Description BLOOD LEFT FOREARM  Final   Special Requests   Final    BOTTLES DRAWN AEROBIC AND ANAEROBIC Blood Culture adequate volume Performed at [facility], [place] [address], [place], [place] [address]    Culture NO GROWTH < 24 HOURS  Final   Report Status PENDING  Incomplete    FURTHER DISCHARGE INSTRUCTIONS:  Get Medicines reviewed and adjusted: Please take all your medications with you for your next visit with your Primary MD  Laboratory/radiological data: Please request your Primary MD to go over all hospital tests and procedure/radiological results at the follow up, please ask your Primary MD to get all Hospital records sent to his/her office.  In some cases, they will be blood work, cultures and biopsy results  pending at the time of your discharge. Please request that your primary care M.D. goes through all the records of your hospital data and follows up on these results.  Also Note the following: If you experience worsening of your admission symptoms, develop shortness of breath, life threatening emergency, suicidal or homicidal thoughts you must seek medical attention immediately by calling 911 or calling your MD immediately  if symptoms less severe.  You must read complete instructions/literature along with all the possible adverse reactions/side effects for all the Medicines you take and that have  been prescribed to you. Take any new Medicines after you have completely understood and accpet all the possible adverse reactions/side effects.   Do not drive when taking Pain medications or sleeping medications (Benzodaizepines)  Do not take more than prescribed Pain, Sleep and Anxiety Medications. It is not advisable to combine anxiety,sleep and pain medications without talking with your primary care practitioner  Special Instructions: If you have smoked or chewed Tobacco  in the last 2 yrs please stop smoking, stop any regular Alcohol  and or any Recreational drug use.  Wear Seat belts while driving.  Please note: You were cared for by a hospitalist during your hospital stay. Once you are discharged, your primary care physician will handle any further medical issues. Please note that NO REFILLS for any discharge medications will be authorized once you are discharged, as it is imperative that you return to your primary care physician (or establish a relationship with a primary care physician if you do not have one) for your post hospital discharge needs so that they can reassess your need for medications and monitor your lab values.  Total Time spent coordinating discharge including counseling, education and face to face time equals 35 minutes.  Signed: Sherelle Castelli 02/19/2019 10:15 AM

## 2019-02-19 NOTE — Care Management (Signed)
Pt deemed stable for discharge home today.  CM acknowledges consult for home oxygen written on 02/17/19 - Per attending Dr Sloan Leiter pt no longer requires home oxygen. CM requested discontinuation of home oxygen order. CM reviewed pts chart for TOC needs/orders/consults- none determined.  CM signing off

## 2019-02-20 ENCOUNTER — Ambulatory Visit (HOSPITAL_COMMUNITY)
Admission: RE | Admit: 2019-02-20 | Discharge: 2019-02-20 | Disposition: A | Discharge status: Home / Self Care | Payer: 59 | Source: Ambulatory Visit | Attending: Pulmonary Disease | Admitting: Pulmonary Disease

## 2019-02-20 DIAGNOSIS — J1289 Other viral pneumonia: Secondary | ICD-10-CM | POA: Insufficient documentation

## 2019-02-20 DIAGNOSIS — U071 COVID-19: Secondary | ICD-10-CM | POA: Insufficient documentation

## 2019-02-20 MED ORDER — METHYLPREDNISOLONE SODIUM SUCC 125 MG IJ SOLR: 125.0000 mg | Freq: Once | INTRAMUSCULAR | Status: DC | PRN

## 2019-02-20 MED ORDER — FAMOTIDINE IN NACL 20-0.9 MG/50ML-% IV SOLN: 20.0000 mg | Freq: Once | INTRAVENOUS | Status: DC | PRN

## 2019-02-20 MED ORDER — ALBUTEROL SULFATE HFA 108 (90 BASE) MCG/ACT IN AERS: 2.0000 | INHALATION_SPRAY | Freq: Once | RESPIRATORY_TRACT | Status: DC | PRN

## 2019-02-20 MED ORDER — DIPHENHYDRAMINE HCL 50 MG/ML IJ SOLN: 50.0000 mg | Freq: Once | INTRAMUSCULAR | Status: DC | PRN

## 2019-02-20 MED ORDER — EPINEPHRINE 0.3 MG/0.3ML IJ SOAJ: 0.3000 mg | Freq: Once | INTRAMUSCULAR | Status: DC | PRN

## 2019-02-20 MED ORDER — SODIUM CHLORIDE 0.9 % IV SOLN: INTRAVENOUS | Status: DC | PRN

## 2019-02-20 MED ORDER — SODIUM CHLORIDE 0.9 % IV SOLN
100.0000 mg | Freq: Once | INTRAVENOUS | Status: AC
  Administered 2019-02-20: 100 mg via INTRAVENOUS
  Filled 2019-02-20: qty 20

## 2019-02-20 NOTE — Progress Notes (Signed)
  Diagnosis: COVID-19  Physician: Dr. Joya Gaskins  Procedure: Covid Infusion Clinic Med: remdesivir infusion.  Complications: No immediate complications noted.  Discharge: Discharged home   Janine Ores 02/20/2019

## 2019-02-21 ENCOUNTER — Ambulatory Visit (HOSPITAL_COMMUNITY)
Admit: 2019-02-21 | Discharge: 2019-02-21 | Disposition: A | Discharge status: Home / Self Care | Payer: 59 | Attending: Pulmonary Disease | Admitting: Pulmonary Disease

## 2019-02-21 DIAGNOSIS — U071 COVID-19: Secondary | ICD-10-CM | POA: Diagnosis not present

## 2019-02-21 MED ORDER — DIPHENHYDRAMINE HCL 50 MG/ML IJ SOLN: 50.0000 mg | Freq: Once | INTRAMUSCULAR | Status: DC | PRN

## 2019-02-21 MED ORDER — METHYLPREDNISOLONE SODIUM SUCC 125 MG IJ SOLR: 125.0000 mg | Freq: Once | INTRAMUSCULAR | Status: DC | PRN

## 2019-02-21 MED ORDER — EPINEPHRINE 0.3 MG/0.3ML IJ SOAJ: 0.3000 mg | Freq: Once | INTRAMUSCULAR | Status: DC | PRN

## 2019-02-21 MED ORDER — ALBUTEROL SULFATE HFA 108 (90 BASE) MCG/ACT IN AERS: 2.0000 | INHALATION_SPRAY | Freq: Once | RESPIRATORY_TRACT | Status: DC | PRN

## 2019-02-21 MED ORDER — SODIUM CHLORIDE 0.9 % IV SOLN
100.0000 mg | Freq: Once | INTRAVENOUS | Status: AC
  Administered 2019-02-21: 10:00:00 100 mg via INTRAVENOUS

## 2019-02-21 MED ORDER — SODIUM CHLORIDE 0.9 % IV SOLN: INTRAVENOUS | Status: DC | PRN

## 2019-02-21 MED ORDER — FAMOTIDINE IN NACL 20-0.9 MG/50ML-% IV SOLN: 20.0000 mg | Freq: Once | INTRAVENOUS | Status: DC | PRN

## 2019-02-21 NOTE — Progress Notes (Signed)
  Diagnosis: COVID-19  Physician: Dr. Joya Gaskins  Procedure: Covid Infusion Clinic Med: remdesivir infusion.  Complications: No immediate complications noted.  Discharge: Discharged home   Stephen Dorsey 02/21/2019

## 2019-02-22 LAB — CULTURE, BLOOD (ROUTINE X 2)
Culture: NO GROWTH
Culture: NO GROWTH
Special Requests: ADEQUATE

## 2019-05-10 ENCOUNTER — Ambulatory Visit: Payer: 59 | Attending: Internal Medicine

## 2019-05-10 DIAGNOSIS — Z23 Encounter for immunization: Secondary | ICD-10-CM

## 2019-05-10 NOTE — Progress Notes (Signed)
   Covid-19 Vaccination Clinic  Name:  Stephen Dorsey    MRN: [id number] DOB: 31-Oct-1960  05/10/2019  [name] was observed post Covid-19 immunization for 15 minutes without incident. He was provided with Vaccine Information Sheet and instruction to access the V-Safe system.   [name] was instructed to call 911 with any severe reactions post vaccine: [name] Difficulty breathing  . Swelling of face and throat  . A fast heartbeat  . A bad rash all over body  . Dizziness and weakness   Immunizations Administered    Name Date Dose VIS Date Route   Pfizer COVID-19 Vaccine 05/10/2019 10:15 AM 0.3 mL 03/07/2018 Intramuscular   Manufacturer: [place]   Lot: [id number]   [place]: [id number]

## 2019-06-04 ENCOUNTER — Ambulatory Visit: Payer: Self-pay | Attending: Internal Medicine

## 2019-06-04 DIAGNOSIS — Z23 Encounter for immunization: Secondary | ICD-10-CM

## 2019-06-04 NOTE — Progress Notes (Signed)
   Covid-19 Vaccination Clinic  Name:  Stephen Dorsey    MRN: [id number] DOB: 06-10-1960  06/04/2019  [name] was observed post Covid-19 immunization for 15 minutes without incident. He was provided with Vaccine Information Sheet and instruction to access the V-Safe system.   [name] was instructed to call 911 with any severe reactions post vaccine: [name] Difficulty breathing  . Swelling of face and throat  . A fast heartbeat  . A bad rash all over body  . Dizziness and weakness   Immunizations Administered    Name Date Dose VIS Date Route   Pfizer COVID-19 Vaccine 06/04/2019 10:18 AM 0.3 mL 03/07/2018 Intramuscular   Manufacturer: [employer], [employer]   Lot: [id number]   [place]: [id number]

## 2019-08-09 DIAGNOSIS — L539 Erythematous condition, unspecified: Secondary | ICD-10-CM | POA: Diagnosis not present

## 2019-08-24 DIAGNOSIS — E7800 Pure hypercholesterolemia, unspecified: Secondary | ICD-10-CM | POA: Diagnosis not present

## 2019-08-24 DIAGNOSIS — Z1211 Encounter for screening for malignant neoplasm of colon: Secondary | ICD-10-CM | POA: Diagnosis not present

## 2019-08-24 DIAGNOSIS — I10 Essential (primary) hypertension: Secondary | ICD-10-CM | POA: Diagnosis not present

## 2019-08-24 DIAGNOSIS — Z0000 Encounter for general adult medical examination without abnormal findings: Secondary | ICD-10-CM | POA: Diagnosis not present

## 2019-08-24 DIAGNOSIS — R7989 Other specified abnormal findings of blood chemistry: Secondary | ICD-10-CM | POA: Diagnosis not present

## 2019-08-24 DIAGNOSIS — Z125 Encounter for screening for malignant neoplasm of prostate: Secondary | ICD-10-CM | POA: Diagnosis not present

## 2019-08-24 DIAGNOSIS — R7303 Prediabetes: Secondary | ICD-10-CM | POA: Diagnosis not present

## 2019-08-24 DIAGNOSIS — G629 Polyneuropathy, unspecified: Secondary | ICD-10-CM | POA: Diagnosis not present

## 2019-08-24 DIAGNOSIS — E291 Testicular hypofunction: Secondary | ICD-10-CM | POA: Diagnosis not present

## 2019-08-24 DIAGNOSIS — G609 Hereditary and idiopathic neuropathy, unspecified: Secondary | ICD-10-CM | POA: Diagnosis not present

## 2019-09-11 DIAGNOSIS — L578 Other skin changes due to chronic exposure to nonionizing radiation: Secondary | ICD-10-CM | POA: Diagnosis not present

## 2019-09-11 DIAGNOSIS — W908XXS Exposure to other nonionizing radiation, sequela: Secondary | ICD-10-CM | POA: Diagnosis not present

## 2019-09-11 DIAGNOSIS — L821 Other seborrheic keratosis: Secondary | ICD-10-CM | POA: Diagnosis not present

## 2019-09-11 DIAGNOSIS — B078 Other viral warts: Secondary | ICD-10-CM | POA: Diagnosis not present

## 2019-09-11 DIAGNOSIS — D225 Melanocytic nevi of trunk: Secondary | ICD-10-CM | POA: Diagnosis not present

## 2019-09-11 DIAGNOSIS — L814 Other melanin hyperpigmentation: Secondary | ICD-10-CM | POA: Diagnosis not present

## 2019-09-11 DIAGNOSIS — X32XXXS Exposure to sunlight, sequela: Secondary | ICD-10-CM | POA: Diagnosis not present

## 2019-10-05 DIAGNOSIS — R972 Elevated prostate specific antigen [PSA]: Secondary | ICD-10-CM | POA: Diagnosis not present

## 2019-10-05 DIAGNOSIS — G609 Hereditary and idiopathic neuropathy, unspecified: Secondary | ICD-10-CM | POA: Diagnosis not present

## 2019-10-05 DIAGNOSIS — R7989 Other specified abnormal findings of blood chemistry: Secondary | ICD-10-CM | POA: Diagnosis not present

## 2019-10-05 DIAGNOSIS — E7800 Pure hypercholesterolemia, unspecified: Secondary | ICD-10-CM | POA: Diagnosis not present

## 2019-10-05 DIAGNOSIS — G629 Polyneuropathy, unspecified: Secondary | ICD-10-CM | POA: Diagnosis not present

## 2019-10-05 DIAGNOSIS — R7303 Prediabetes: Secondary | ICD-10-CM | POA: Diagnosis not present

## 2019-10-05 DIAGNOSIS — I10 Essential (primary) hypertension: Secondary | ICD-10-CM | POA: Diagnosis not present

## 2019-10-05 DIAGNOSIS — E291 Testicular hypofunction: Secondary | ICD-10-CM | POA: Diagnosis not present

## 2019-10-05 DIAGNOSIS — R899 Unspecified abnormal finding in specimens from other organs, systems and tissues: Secondary | ICD-10-CM | POA: Diagnosis not present

## 2019-10-09 DIAGNOSIS — R7303 Prediabetes: Secondary | ICD-10-CM | POA: Diagnosis not present

## 2019-10-09 DIAGNOSIS — E291 Testicular hypofunction: Secondary | ICD-10-CM | POA: Diagnosis not present

## 2019-10-09 DIAGNOSIS — I10 Essential (primary) hypertension: Secondary | ICD-10-CM | POA: Diagnosis not present

## 2019-10-09 DIAGNOSIS — Z5181 Encounter for therapeutic drug level monitoring: Secondary | ICD-10-CM | POA: Diagnosis not present

## 2019-11-20 ENCOUNTER — Other Ambulatory Visit (HOSPITAL_COMMUNITY): Payer: Self-pay | Admitting: Family Medicine

## 2019-11-20 DIAGNOSIS — I10 Essential (primary) hypertension: Secondary | ICD-10-CM | POA: Diagnosis not present

## 2019-11-20 DIAGNOSIS — Z0184 Encounter for antibody response examination: Secondary | ICD-10-CM | POA: Diagnosis not present

## 2019-11-20 DIAGNOSIS — E291 Testicular hypofunction: Secondary | ICD-10-CM | POA: Diagnosis not present

## 2019-11-20 DIAGNOSIS — R7303 Prediabetes: Secondary | ICD-10-CM | POA: Diagnosis not present

## 2019-11-20 DIAGNOSIS — Z23 Encounter for immunization: Secondary | ICD-10-CM | POA: Diagnosis not present

## 2019-11-20 DIAGNOSIS — Z5181 Encounter for therapeutic drug level monitoring: Secondary | ICD-10-CM | POA: Diagnosis not present

## 2020-01-08 DIAGNOSIS — Z0184 Encounter for antibody response examination: Secondary | ICD-10-CM | POA: Diagnosis not present

## 2020-01-08 DIAGNOSIS — I10 Essential (primary) hypertension: Secondary | ICD-10-CM | POA: Diagnosis not present

## 2020-01-08 DIAGNOSIS — Z23 Encounter for immunization: Secondary | ICD-10-CM | POA: Diagnosis not present

## 2020-01-08 DIAGNOSIS — E291 Testicular hypofunction: Secondary | ICD-10-CM | POA: Diagnosis not present

## 2020-01-08 DIAGNOSIS — R202 Paresthesia of skin: Secondary | ICD-10-CM | POA: Diagnosis not present

## 2020-01-08 DIAGNOSIS — R7303 Prediabetes: Secondary | ICD-10-CM | POA: Diagnosis not present

## 2020-01-08 DIAGNOSIS — Z5181 Encounter for therapeutic drug level monitoring: Secondary | ICD-10-CM | POA: Diagnosis not present

## 2020-02-06 MED FILL — LISINOPRIL-HCTZ 20-12.5 MG: 20-12.5 | 90 days supply | Qty: 180 | Fill #0

## 2020-02-07 ENCOUNTER — Other Ambulatory Visit (HOSPITAL_COMMUNITY): Payer: Self-pay | Admitting: Family Medicine

## 2020-02-07 MED FILL — AMLODIPINE BESYLATE 2.5 MG: 2.5 | 90 days supply | Qty: 90 | Fill #0

## 2020-03-11 DIAGNOSIS — L814 Other melanin hyperpigmentation: Secondary | ICD-10-CM | POA: Diagnosis not present

## 2020-03-11 DIAGNOSIS — M71341 Other bursal cyst, right hand: Secondary | ICD-10-CM | POA: Diagnosis not present

## 2020-03-11 DIAGNOSIS — L570 Actinic keratosis: Secondary | ICD-10-CM | POA: Diagnosis not present

## 2020-03-11 DIAGNOSIS — D2371 Other benign neoplasm of skin of right lower limb, including hip: Secondary | ICD-10-CM | POA: Diagnosis not present

## 2020-03-11 DIAGNOSIS — L578 Other skin changes due to chronic exposure to nonionizing radiation: Secondary | ICD-10-CM | POA: Diagnosis not present

## 2020-03-11 DIAGNOSIS — L821 Other seborrheic keratosis: Secondary | ICD-10-CM | POA: Diagnosis not present

## 2020-03-11 DIAGNOSIS — X32XXXS Exposure to sunlight, sequela: Secondary | ICD-10-CM | POA: Diagnosis not present

## 2020-03-14 DIAGNOSIS — Z23 Encounter for immunization: Secondary | ICD-10-CM | POA: Diagnosis not present

## 2020-03-21 DIAGNOSIS — M3119 Other thrombotic microangiopathy: Secondary | ICD-10-CM | POA: Diagnosis not present

## 2020-03-21 DIAGNOSIS — R739 Hyperglycemia, unspecified: Secondary | ICD-10-CM | POA: Diagnosis not present

## 2020-03-21 DIAGNOSIS — E559 Vitamin D deficiency, unspecified: Secondary | ICD-10-CM | POA: Diagnosis not present

## 2020-03-21 DIAGNOSIS — M810 Age-related osteoporosis without current pathological fracture: Secondary | ICD-10-CM | POA: Diagnosis not present

## 2020-03-21 DIAGNOSIS — Z20828 Contact with and (suspected) exposure to other viral communicable diseases: Secondary | ICD-10-CM | POA: Diagnosis not present

## 2020-03-21 DIAGNOSIS — E7211 Homocystinuria: Secondary | ICD-10-CM | POA: Diagnosis not present

## 2020-03-21 DIAGNOSIS — M3581 Multisystem inflammatory syndrome: Secondary | ICD-10-CM | POA: Diagnosis not present

## 2020-03-21 DIAGNOSIS — R7982 Elevated C-reactive protein (CRP): Secondary | ICD-10-CM | POA: Diagnosis not present

## 2020-04-01 DIAGNOSIS — R7989 Other specified abnormal findings of blood chemistry: Secondary | ICD-10-CM | POA: Diagnosis not present

## 2020-04-01 DIAGNOSIS — E291 Testicular hypofunction: Secondary | ICD-10-CM | POA: Diagnosis not present

## 2020-04-01 DIAGNOSIS — R7303 Prediabetes: Secondary | ICD-10-CM | POA: Diagnosis not present

## 2020-04-01 DIAGNOSIS — R7301 Impaired fasting glucose: Secondary | ICD-10-CM | POA: Diagnosis not present

## 2020-04-08 ENCOUNTER — Other Ambulatory Visit (HOSPITAL_COMMUNITY): Payer: Self-pay | Admitting: Family Medicine

## 2020-04-08 DIAGNOSIS — E6609 Other obesity due to excess calories: Secondary | ICD-10-CM | POA: Diagnosis not present

## 2020-04-08 DIAGNOSIS — E1169 Type 2 diabetes mellitus with other specified complication: Secondary | ICD-10-CM | POA: Diagnosis not present

## 2020-04-08 DIAGNOSIS — Z6834 Body mass index (BMI) 34.0-34.9, adult: Secondary | ICD-10-CM | POA: Diagnosis not present

## 2020-04-08 DIAGNOSIS — E291 Testicular hypofunction: Secondary | ICD-10-CM | POA: Diagnosis not present

## 2020-04-08 DIAGNOSIS — Z5181 Encounter for therapeutic drug level monitoring: Secondary | ICD-10-CM | POA: Diagnosis not present

## 2020-04-08 DIAGNOSIS — R5383 Other fatigue: Secondary | ICD-10-CM | POA: Diagnosis not present

## 2020-04-08 DIAGNOSIS — E7800 Pure hypercholesterolemia, unspecified: Secondary | ICD-10-CM | POA: Diagnosis not present

## 2020-04-08 DIAGNOSIS — R748 Abnormal levels of other serum enzymes: Secondary | ICD-10-CM | POA: Diagnosis not present

## 2020-04-08 MED FILL — metFORMIN HCL ER 500 MG TB2: 500 | 30 days supply | Qty: 30 | Fill #0

## 2020-05-13 ENCOUNTER — Other Ambulatory Visit (HOSPITAL_COMMUNITY): Payer: Self-pay

## 2020-05-13 MED ORDER — LISINOPRIL-HYDROCHLOROTHIAZIDE 20-12.5 MG PO TABS
ORAL_TABLET | ORAL | 0 refills | Status: DC
  Filled 2020-05-13 – 2020-11-02 (×4): qty 180, 90d supply, fill #0

## 2020-05-13 MED FILL — Lisinopril & Hydrochlorothiazide Tab 20-12.5 MG: ORAL | 90 days supply | Qty: 180 | Fill #0 | Status: AC

## 2020-05-13 MED FILL — Amlodipine Besylate Tab 2.5 MG (Base Equivalent): ORAL | 90 days supply | Qty: 90 | Fill #0 | Status: AC

## 2020-05-13 MED FILL — Metformin HCl Tab ER 24HR 500 MG: ORAL | 30 days supply | Qty: 30 | Fill #0 | Status: AC

## 2020-06-10 ENCOUNTER — Other Ambulatory Visit (HOSPITAL_COMMUNITY): Payer: Self-pay

## 2020-06-10 MED ORDER — CARESTART COVID-19 HOME TEST VI KIT
PACK | 0 refills | Status: DC
  Filled 2020-06-10 – 2020-06-19 (×2): qty 4, 4d supply, fill #0

## 2020-06-16 ENCOUNTER — Other Ambulatory Visit (HOSPITAL_COMMUNITY): Payer: Self-pay

## 2020-06-16 MED FILL — Metformin HCl Tab ER 24HR 500 MG: ORAL | 30 days supply | Qty: 30 | Fill #1 | Status: AC

## 2020-06-16 MED FILL — Lisinopril & Hydrochlorothiazide Tab 20-12.5 MG: ORAL | 90 days supply | Qty: 180 | Fill #1 | Status: CN

## 2020-06-17 ENCOUNTER — Other Ambulatory Visit (HOSPITAL_COMMUNITY): Payer: Self-pay

## 2020-06-17 MED ORDER — AMLODIPINE BESYLATE 2.5 MG PO TABS
ORAL_TABLET | ORAL | 0 refills | Status: DC
  Filled 2020-06-17 – 2020-06-19 (×4): qty 90, 90d supply, fill #0

## 2020-06-18 ENCOUNTER — Other Ambulatory Visit (HOSPITAL_COMMUNITY): Payer: Self-pay

## 2020-06-19 ENCOUNTER — Other Ambulatory Visit (HOSPITAL_COMMUNITY): Payer: Self-pay

## 2020-06-19 MED FILL — Lisinopril & Hydrochlorothiazide Tab 20-12.5 MG: ORAL | 90 days supply | Qty: 180 | Fill #1 | Status: AC

## 2020-07-16 DIAGNOSIS — Z5181 Encounter for therapeutic drug level monitoring: Secondary | ICD-10-CM | POA: Diagnosis not present

## 2020-07-16 DIAGNOSIS — R5383 Other fatigue: Secondary | ICD-10-CM | POA: Diagnosis not present

## 2020-07-16 DIAGNOSIS — E7800 Pure hypercholesterolemia, unspecified: Secondary | ICD-10-CM | POA: Diagnosis not present

## 2020-07-16 DIAGNOSIS — E291 Testicular hypofunction: Secondary | ICD-10-CM | POA: Diagnosis not present

## 2020-07-16 DIAGNOSIS — Z6834 Body mass index (BMI) 34.0-34.9, adult: Secondary | ICD-10-CM | POA: Diagnosis not present

## 2020-07-16 DIAGNOSIS — E6609 Other obesity due to excess calories: Secondary | ICD-10-CM | POA: Diagnosis not present

## 2020-07-16 DIAGNOSIS — R748 Abnormal levels of other serum enzymes: Secondary | ICD-10-CM | POA: Diagnosis not present

## 2020-07-16 DIAGNOSIS — E1169 Type 2 diabetes mellitus with other specified complication: Secondary | ICD-10-CM | POA: Diagnosis not present

## 2020-07-22 DIAGNOSIS — E6609 Other obesity due to excess calories: Secondary | ICD-10-CM | POA: Diagnosis not present

## 2020-07-22 DIAGNOSIS — E7800 Pure hypercholesterolemia, unspecified: Secondary | ICD-10-CM | POA: Diagnosis not present

## 2020-07-22 DIAGNOSIS — R748 Abnormal levels of other serum enzymes: Secondary | ICD-10-CM | POA: Diagnosis not present

## 2020-07-22 DIAGNOSIS — Z6834 Body mass index (BMI) 34.0-34.9, adult: Secondary | ICD-10-CM | POA: Diagnosis not present

## 2020-07-22 DIAGNOSIS — Z5181 Encounter for therapeutic drug level monitoring: Secondary | ICD-10-CM | POA: Diagnosis not present

## 2020-07-22 DIAGNOSIS — E1169 Type 2 diabetes mellitus with other specified complication: Secondary | ICD-10-CM | POA: Diagnosis not present

## 2020-07-22 DIAGNOSIS — E291 Testicular hypofunction: Secondary | ICD-10-CM | POA: Diagnosis not present

## 2020-07-22 DIAGNOSIS — R5383 Other fatigue: Secondary | ICD-10-CM | POA: Diagnosis not present

## 2020-07-22 DIAGNOSIS — M6740 Ganglion, unspecified site: Secondary | ICD-10-CM | POA: Diagnosis not present

## 2020-07-31 ENCOUNTER — Other Ambulatory Visit (HOSPITAL_COMMUNITY): Payer: Self-pay

## 2020-07-31 MED ORDER — METFORMIN HCL 500 MG PO TABS
500.0000 mg | ORAL_TABLET | Freq: Two times a day (BID) | ORAL | 3 refills | Status: DC
  Filled 2020-07-31: qty 90, 45d supply, fill #0
  Filled 2020-11-02 – 2020-11-04 (×2): qty 90, 45d supply, fill #1
  Filled 2021-02-08: qty 90, 45d supply, fill #2

## 2020-07-31 MED ORDER — METFORMIN HCL ER 500 MG PO TB24
ORAL_TABLET | ORAL | 1 refills | Status: DC
  Filled 2020-07-31: qty 90, 90d supply, fill #0
  Filled 2020-11-04: qty 90, 90d supply, fill #1

## 2020-08-11 ENCOUNTER — Other Ambulatory Visit (HOSPITAL_COMMUNITY): Payer: Self-pay

## 2020-08-11 MED ORDER — TADALAFIL 10 MG PO TABS
ORAL_TABLET | ORAL | 11 refills | Status: DC
  Filled 2020-08-11: qty 6, 30d supply, fill #0
  Filled 2020-09-11: qty 6, 30d supply, fill #1
  Filled 2020-10-14: qty 6, 30d supply, fill #2
  Filled 2020-11-02 – 2021-01-07 (×2): qty 6, 30d supply, fill #3
  Filled 2021-02-08: qty 6, 30d supply, fill #4
  Filled 2021-03-17: qty 6, 30d supply, fill #5
  Filled 2021-04-14: qty 6, 30d supply, fill #6
  Filled 2021-05-16: qty 6, 30d supply, fill #7
  Filled 2021-06-25: qty 6, 30d supply, fill #8
  Filled 2021-07-29: qty 6, 30d supply, fill #9

## 2020-08-28 DIAGNOSIS — I10 Essential (primary) hypertension: Secondary | ICD-10-CM | POA: Diagnosis not present

## 2020-08-28 DIAGNOSIS — Z79890 Hormone replacement therapy (postmenopausal): Secondary | ICD-10-CM | POA: Diagnosis not present

## 2020-08-28 DIAGNOSIS — E785 Hyperlipidemia, unspecified: Secondary | ICD-10-CM | POA: Diagnosis not present

## 2020-08-28 DIAGNOSIS — E119 Type 2 diabetes mellitus without complications: Secondary | ICD-10-CM | POA: Diagnosis not present

## 2020-08-28 DIAGNOSIS — M810 Age-related osteoporosis without current pathological fracture: Secondary | ICD-10-CM | POA: Diagnosis not present

## 2020-08-28 DIAGNOSIS — A493 Mycoplasma infection, unspecified site: Secondary | ICD-10-CM | POA: Diagnosis not present

## 2020-08-28 DIAGNOSIS — M3581 Multisystem inflammatory syndrome: Secondary | ICD-10-CM | POA: Diagnosis not present

## 2020-08-28 DIAGNOSIS — E291 Testicular hypofunction: Secondary | ICD-10-CM | POA: Diagnosis not present

## 2020-08-28 DIAGNOSIS — M311 Thrombotic microangiopathy, unspecified: Secondary | ICD-10-CM | POA: Diagnosis not present

## 2020-08-28 DIAGNOSIS — J160 Chlamydial pneumonia: Secondary | ICD-10-CM | POA: Diagnosis not present

## 2020-09-11 ENCOUNTER — Other Ambulatory Visit (HOSPITAL_COMMUNITY): Payer: Self-pay

## 2020-10-14 ENCOUNTER — Other Ambulatory Visit (HOSPITAL_COMMUNITY): Payer: Self-pay

## 2020-10-28 DIAGNOSIS — E7800 Pure hypercholesterolemia, unspecified: Secondary | ICD-10-CM | POA: Diagnosis not present

## 2020-10-28 DIAGNOSIS — R5383 Other fatigue: Secondary | ICD-10-CM | POA: Diagnosis not present

## 2020-10-28 DIAGNOSIS — E1169 Type 2 diabetes mellitus with other specified complication: Secondary | ICD-10-CM | POA: Diagnosis not present

## 2020-11-02 ENCOUNTER — Other Ambulatory Visit (HOSPITAL_COMMUNITY): Payer: Self-pay

## 2020-11-02 MED FILL — Metformin HCl Tab ER 24HR 500 MG: ORAL | 30 days supply | Qty: 30 | Fill #2 | Status: CN

## 2020-11-03 ENCOUNTER — Other Ambulatory Visit (HOSPITAL_COMMUNITY): Payer: Self-pay

## 2020-11-03 MED ORDER — AMLODIPINE BESYLATE 2.5 MG PO TABS
2.5000 mg | ORAL_TABLET | Freq: Every day | ORAL | 1 refills | Status: DC
  Filled 2020-11-03: qty 90, 90d supply, fill #0
  Filled 2021-02-08: qty 90, 90d supply, fill #1

## 2020-11-04 ENCOUNTER — Other Ambulatory Visit (HOSPITAL_COMMUNITY): Payer: Self-pay

## 2020-11-04 DIAGNOSIS — E291 Testicular hypofunction: Secondary | ICD-10-CM | POA: Diagnosis not present

## 2020-11-04 DIAGNOSIS — Z6834 Body mass index (BMI) 34.0-34.9, adult: Secondary | ICD-10-CM | POA: Diagnosis not present

## 2020-11-04 DIAGNOSIS — R5383 Other fatigue: Secondary | ICD-10-CM | POA: Diagnosis not present

## 2020-11-04 DIAGNOSIS — E1169 Type 2 diabetes mellitus with other specified complication: Secondary | ICD-10-CM | POA: Diagnosis not present

## 2020-11-04 DIAGNOSIS — E7800 Pure hypercholesterolemia, unspecified: Secondary | ICD-10-CM | POA: Diagnosis not present

## 2020-11-04 DIAGNOSIS — Z5181 Encounter for therapeutic drug level monitoring: Secondary | ICD-10-CM | POA: Diagnosis not present

## 2020-11-04 DIAGNOSIS — E6609 Other obesity due to excess calories: Secondary | ICD-10-CM | POA: Diagnosis not present

## 2020-11-04 DIAGNOSIS — D751 Secondary polycythemia: Secondary | ICD-10-CM | POA: Diagnosis not present

## 2020-12-03 DIAGNOSIS — D751 Secondary polycythemia: Secondary | ICD-10-CM | POA: Diagnosis not present

## 2020-12-03 DIAGNOSIS — Z5181 Encounter for therapeutic drug level monitoring: Secondary | ICD-10-CM | POA: Diagnosis not present

## 2020-12-09 DIAGNOSIS — Z6834 Body mass index (BMI) 34.0-34.9, adult: Secondary | ICD-10-CM | POA: Diagnosis not present

## 2020-12-09 DIAGNOSIS — E7800 Pure hypercholesterolemia, unspecified: Secondary | ICD-10-CM | POA: Diagnosis not present

## 2020-12-09 DIAGNOSIS — Z5181 Encounter for therapeutic drug level monitoring: Secondary | ICD-10-CM | POA: Diagnosis not present

## 2020-12-09 DIAGNOSIS — D751 Secondary polycythemia: Secondary | ICD-10-CM | POA: Diagnosis not present

## 2020-12-09 DIAGNOSIS — E1169 Type 2 diabetes mellitus with other specified complication: Secondary | ICD-10-CM | POA: Diagnosis not present

## 2020-12-09 DIAGNOSIS — Z79890 Hormone replacement therapy (postmenopausal): Secondary | ICD-10-CM | POA: Diagnosis not present

## 2020-12-09 DIAGNOSIS — E6609 Other obesity due to excess calories: Secondary | ICD-10-CM | POA: Diagnosis not present

## 2020-12-09 DIAGNOSIS — E291 Testicular hypofunction: Secondary | ICD-10-CM | POA: Diagnosis not present

## 2020-12-09 DIAGNOSIS — R5383 Other fatigue: Secondary | ICD-10-CM | POA: Diagnosis not present

## 2020-12-11 DIAGNOSIS — E291 Testicular hypofunction: Secondary | ICD-10-CM | POA: Diagnosis not present

## 2020-12-11 DIAGNOSIS — M810 Age-related osteoporosis without current pathological fracture: Secondary | ICD-10-CM | POA: Diagnosis not present

## 2020-12-11 DIAGNOSIS — E559 Vitamin D deficiency, unspecified: Secondary | ICD-10-CM | POA: Diagnosis not present

## 2020-12-11 DIAGNOSIS — Z79890 Hormone replacement therapy (postmenopausal): Secondary | ICD-10-CM | POA: Diagnosis not present

## 2020-12-19 ENCOUNTER — Telehealth: Payer: Self-pay | Admitting: Oncology

## 2020-12-19 NOTE — Telephone Encounter (Signed)
Scheduled appt per 12/2 referral. Pt is aware of appt date and time.

## 2020-12-30 ENCOUNTER — Other Ambulatory Visit: Payer: Self-pay

## 2020-12-30 ENCOUNTER — Inpatient Hospital Stay: Payer: 59 | Attending: Oncology | Admitting: Oncology

## 2020-12-30 VITALS — BP 133/73 | HR 84 | Temp 98.8°F | Resp 17 | Ht 75.0 in | Wt 265.8 lb

## 2020-12-30 DIAGNOSIS — Z7984 Long term (current) use of oral hypoglycemic drugs: Secondary | ICD-10-CM | POA: Insufficient documentation

## 2020-12-30 DIAGNOSIS — E785 Hyperlipidemia, unspecified: Secondary | ICD-10-CM | POA: Diagnosis not present

## 2020-12-30 DIAGNOSIS — Z79899 Other long term (current) drug therapy: Secondary | ICD-10-CM | POA: Insufficient documentation

## 2020-12-30 DIAGNOSIS — D751 Secondary polycythemia: Secondary | ICD-10-CM | POA: Diagnosis not present

## 2020-12-30 DIAGNOSIS — E119 Type 2 diabetes mellitus without complications: Secondary | ICD-10-CM | POA: Insufficient documentation

## 2020-12-30 DIAGNOSIS — E291 Testicular hypofunction: Secondary | ICD-10-CM | POA: Insufficient documentation

## 2020-12-30 DIAGNOSIS — I10 Essential (primary) hypertension: Secondary | ICD-10-CM | POA: Diagnosis not present

## 2020-12-30 DIAGNOSIS — Z7982 Long term (current) use of aspirin: Secondary | ICD-10-CM | POA: Insufficient documentation

## 2020-12-30 DIAGNOSIS — G4730 Sleep apnea, unspecified: Secondary | ICD-10-CM | POA: Diagnosis not present

## 2020-12-30 DIAGNOSIS — E1169 Type 2 diabetes mellitus with other specified complication: Secondary | ICD-10-CM | POA: Diagnosis not present

## 2020-12-30 DIAGNOSIS — Z79890 Hormone replacement therapy (postmenopausal): Secondary | ICD-10-CM | POA: Diagnosis not present

## 2020-12-30 DIAGNOSIS — Z6834 Body mass index (BMI) 34.0-34.9, adult: Secondary | ICD-10-CM | POA: Diagnosis not present

## 2020-12-30 DIAGNOSIS — E1142 Type 2 diabetes mellitus with diabetic polyneuropathy: Secondary | ICD-10-CM | POA: Diagnosis not present

## 2020-12-30 NOTE — Progress Notes (Signed)
Reason for the request:    Polycythemia  HPI: I was asked by Dr. Jacelyn Grip to evaluate [name] for the evaluation of polycythemia.  He is a 60 year old with history of hypertension and neuropathy he was found to have elevated hemoglobin and hematocrit.  Laboratory data from November 2022 showed hemoglobin of 17.8 with a white cell count of 10.5 and platelet of 245.  CBC obtained in October 2022 showed a hemoglobin of 17.9 with a hematocrit of 55.  White cell count was 8.3 and platelets of 194.  CBC obtained in February 2021 showed a hemoglobin of 12.8.  He was diagnosed with testosterone deficiency and has been receiving supplements.  He was receiving testosterone cream and injections last year and was not effective.  He did have higher dose of testosterone pellet insertions which have helped boost his testosterone level to over thousand.  This coincided with the increase in his hemoglobin.  He did donate blood at the [facility] and November 2022.  Clinically, he reports no complaints at this time.  He denies any other risk factors including her monoxide exposure or smoking.  He has been told that he snores but no formal diagnosis of sleep apnea.  He does not report any headaches, blurry vision, syncope or seizures. Does not report any fevers, chills or sweats.  Does not report any cough, wheezing or hemoptysis.  Does not report any chest pain, palpitation, orthopnea or leg edema.  Does not report any nausea, vomiting or abdominal pain.  Does not report any constipation or diarrhea.  Does not report any skeletal complaints.    Does not report frequency, urgency or hematuria.  Does not report any skin rashes or lesions. Does not report any heat or cold intolerance.  Does not report any lymphadenopathy or petechiae.  Does not report any anxiety or depression.  Remaining review of systems is negative.     Past Medical History:  Diagnosis Date   Chest pain    Occasional CP   Colon polyps    By colonoscopy  in 2009    Decreased pedal pulses    Dermatitis    Edema    Elevated fasting glucose    Elevated glucose    Hypertension    Idiopathic small fiber sensory neuropathy 2008   Dx in 2008. Getting worse, has trouble feeling his feet. No Sx's in arms.   Kidney stone 1998   Peripheral neuropathy   :   Past Surgical History:  Procedure Laterality Date   KIDNEY STONE SURGERY    :   Current Outpatient Medications:    amLODipine (NORVASC) 2.5 MG tablet, TAKE 1 TABLET BY MOUTH ONCE DAILY, Disp: 90 tablet, Rfl: 1   amLODipine (NORVASC) 2.5 MG tablet, Take 1 tablet by mouth once a day, Disp: 90 tablet, Rfl: 1   Ascorbic Acid (VITAMIN C PO), Take 1 tablet by mouth daily., Disp: , Rfl:    aspirin EC 81 MG tablet, Take 81 mg by mouth daily., Disp: , Rfl:    b complex vitamins tablet, Take 1 tablet by mouth daily., Disp: , Rfl:    COVID-19 At Home Antigen Test (CARESTART COVID-19 HOME TEST) KIT, use as directed, Disp: 4 each, Rfl: 0   Cyanocobalamin (VITAMIN B-12) 5000 MCG TBDP, Take 1 tablet by mouth daily. , Disp: , Rfl:    dexamethasone (DECADRON) 6 MG tablet, Take 1 tablet (6 mg total) by mouth daily., Disp: 3 tablet, Rfl: 0   folic acid (FOLVITE) 564 MCG  tablet, Take 400 mcg by mouth daily., Disp: , Rfl:    lisinopril (ZESTRIL) 20 MG tablet, Take 40 mg by mouth daily., Disp: , Rfl:    lisinopril-hydrochlorothiazide (ZESTORETIC) 20-12.5 MG tablet, TAKE 2 TABLETS BY MOUTH DAILY, Disp: 180 tablet, Rfl: 1   lisinopril-hydrochlorothiazide (ZESTORETIC) 20-12.5 MG tablet, TAKE 2 TABLETS BY MOUTH DAILY, Disp: 180 tablet, Rfl: 0   lisinopril-hydrochlorothiazide (ZESTORETIC) 20-12.5 MG tablet, Take 2 tablets by mouth daily, Disp: 180 tablet, Rfl: 0   Magnesium 500 MG CAPS, Take 500 mg by mouth daily. , Disp: , Rfl:    metFORMIN (GLUCOPHAGE) 500 MG tablet, Take 1 tablet (500 mg total) by mouth 2 (two) times daily with breakfast and dinner., Disp: 90 tablet, Rfl: 3   metFORMIN (GLUCOPHAGE-XR) 500 MG 24  hr tablet, TAKE 1 TABLET BY MOUTH ONCE A DAY WITH EVENING MEAL AS DIRECTED, Disp: 30 tablet, Rfl: 5   metFORMIN (GLUCOPHAGE-XR) 500 MG 24 hr tablet, Take 1 tablet by mouth daily with evening meal, Disp: 90 tablet, Rfl: 1   Omega-3 Fatty Acids (FISH OIL PO), Take 1,400 mg by mouth daily., Disp: , Rfl:    Potassium 99 MG TABS, Take 99 mg by mouth daily. Daily , Disp: , Rfl:    tadalafil (CIALIS) 10 MG tablet, Take 1 tablet by mouth daily as needed and take 1 - 2 tablets as needed prior to sexual activity, Disp: 30 tablet, Rfl: 11   VITAMIN D, CHOLECALCIFEROL, PO, Take 1 tablet by mouth daily. , Disp: , Rfl: :  No Known Allergies:   Family History  Problem Relation Age of Onset   Thyroid disease Mother        thyroid problems   Lung cancer Father    Thyroid disease Sister    Diabetes Maternal Grandfather   :   Social History   Socioeconomic History   Marital status: Married    Spouse name: Stephen Dorsey   Number of children: 2   Years of education: College   Highest education level: Not on file  Occupational History    Comment: self employed  Tobacco Use   Smoking status: Never   Smokeless tobacco: Never  Vaping Use   Vaping Use: Never used  Substance and Sexual Activity   Alcohol use: Yes    Alcohol/week: 2.0 standard drinks    Types: 2 Cans of beer per week    Comment: 2+ beers per week   Drug use: No   Sexual activity: Yes    Partners: Female    Birth control/protection: None    Comment: wife  Other Topics Concern   Not on file  Social History Narrative   Patient lives at home with family.  Works for [employer].  Education 4 yr college.  [employer].   Caffeine Use: 20 0z daily   Social Determinants of Health   Financial Resource Strain: Not on file  Food Insecurity: Not on file  Transportation Needs: Not on file  Physical Activity: Not on file  Stress: Not on file  Social Connections: Not on file  Intimate Partner Violence: Not on file  :  Pertinent items are  noted in HPI.  Exam: Blood pressure 133/73, pulse 84, temperature 98.8 F (37.1 C), temperature source Temporal, resp. rate 17, height _0  (1.905 m), weight 265 lb 12.8 oz (120.6 kg), SpO2 98 %. ECOG 0 General appearance: alert and cooperative appeared without distress. Head: atraumatic without any abnormalities. Eyes: conjunctivae/corneas clear. PERRL.  Sclera anicteric. Throat: lips, mucosa, and  tongue normal; without oral thrush or ulcers. Resp: clear to auscultation bilaterally without rhonchi, wheezes or dullness to percussion. Cardio: regular rate and rhythm, S1, S2 normal, no murmur, click, rub or gallop GI: soft, non-tender; bowel sounds normal; no masses,  no organomegaly Skin: Skin color, texture, turgor normal. No rashes or lesions Lymph nodes: Cervical, supraclavicular, and axillary nodes normal. Neurologic: Grossly normal without any motor, sensory or deep tendon reflexes. Musculoskeletal: No joint deformity or effusion.    Assessment and Plan:    60 year old with:  1.  Polycythemia noted on laboratory testing in October and in November 2022 with a hemoglobin ranging between 17.8 and 17.7.  He has normal CBC otherwise.  The differential diagnosis was discussed at this time.  Secondary causes including now testosterone replacement remain the most likely etiology.  sleep apnea could also be contributing factor.  Polycythemia vera is considered unlikely at this time as a primary hematological condition.  Last year his CBC was normal and he has normal white cell count and platelet count.  From a management standpoint, his degree of polycythemia is rather mild and no immediate intervention is needed.  I recommended periodic blood donation once to twice a year which could help alleviate some of the effects of testosterone replacement.  Reducing testosterone dosing and different modality could also be considered.  I agree with endocrinology evaluation regarding this issue.  2.   Thrombosis prophylaxis: Recommended low-dose aspirin to reduce that risk which is low to start with.  I did recommend exercise and weight loss as a modality to reduce those risks as well.    3.  Follow-up: Will be as needed in the future.  45  minutes were dedicated to this visit. The time was spent on reviewing laboratory data, discussing treatment options, discussing differential diagnosis and answering questions regarding future plan.     A copy of this consult has been forwarded to the requesting physician.

## 2021-01-07 ENCOUNTER — Other Ambulatory Visit (HOSPITAL_COMMUNITY): Payer: Self-pay

## 2021-01-22 DIAGNOSIS — E6609 Other obesity due to excess calories: Secondary | ICD-10-CM | POA: Diagnosis not present

## 2021-01-22 DIAGNOSIS — R7989 Other specified abnormal findings of blood chemistry: Secondary | ICD-10-CM | POA: Diagnosis not present

## 2021-01-22 DIAGNOSIS — D751 Secondary polycythemia: Secondary | ICD-10-CM | POA: Diagnosis not present

## 2021-01-22 DIAGNOSIS — Z79890 Hormone replacement therapy (postmenopausal): Secondary | ICD-10-CM | POA: Diagnosis not present

## 2021-01-22 DIAGNOSIS — E291 Testicular hypofunction: Secondary | ICD-10-CM | POA: Diagnosis not present

## 2021-01-22 DIAGNOSIS — Z6834 Body mass index (BMI) 34.0-34.9, adult: Secondary | ICD-10-CM | POA: Diagnosis not present

## 2021-01-22 DIAGNOSIS — R5383 Other fatigue: Secondary | ICD-10-CM | POA: Diagnosis not present

## 2021-01-22 DIAGNOSIS — Z5181 Encounter for therapeutic drug level monitoring: Secondary | ICD-10-CM | POA: Diagnosis not present

## 2021-01-22 DIAGNOSIS — H5213 Myopia, bilateral: Secondary | ICD-10-CM | POA: Diagnosis not present

## 2021-01-22 DIAGNOSIS — E1169 Type 2 diabetes mellitus with other specified complication: Secondary | ICD-10-CM | POA: Diagnosis not present

## 2021-01-22 DIAGNOSIS — E7800 Pure hypercholesterolemia, unspecified: Secondary | ICD-10-CM | POA: Diagnosis not present

## 2021-02-06 DIAGNOSIS — Z79890 Hormone replacement therapy (postmenopausal): Secondary | ICD-10-CM | POA: Diagnosis not present

## 2021-02-06 DIAGNOSIS — E291 Testicular hypofunction: Secondary | ICD-10-CM | POA: Diagnosis not present

## 2021-02-08 ENCOUNTER — Other Ambulatory Visit (HOSPITAL_COMMUNITY): Payer: Self-pay

## 2021-02-08 MED FILL — Metformin HCl Tab ER 24HR 500 MG: ORAL | 30 days supply | Qty: 30 | Fill #2 | Status: CN

## 2021-02-09 ENCOUNTER — Other Ambulatory Visit (HOSPITAL_COMMUNITY): Payer: Self-pay

## 2021-02-09 MED ORDER — LISINOPRIL-HYDROCHLOROTHIAZIDE 20-12.5 MG PO TABS
2.0000 | ORAL_TABLET | Freq: Every day | ORAL | 1 refills | Status: DC
  Filled 2021-02-09: qty 180, 90d supply, fill #0
  Filled 2021-05-16: qty 180, 90d supply, fill #1

## 2021-02-11 ENCOUNTER — Other Ambulatory Visit (HOSPITAL_COMMUNITY): Payer: Self-pay

## 2021-02-11 MED ORDER — METFORMIN HCL ER 500 MG PO TB24
ORAL_TABLET | ORAL | 1 refills | Status: DC
  Filled 2021-02-11 (×3): qty 90, 90d supply, fill #0
  Filled 2021-05-16: qty 90, 90d supply, fill #1

## 2021-03-17 ENCOUNTER — Other Ambulatory Visit (HOSPITAL_COMMUNITY): Payer: Self-pay

## 2021-04-14 ENCOUNTER — Other Ambulatory Visit (HOSPITAL_COMMUNITY): Payer: Self-pay

## 2021-04-14 DIAGNOSIS — M5136 Other intervertebral disc degeneration, lumbar region: Secondary | ICD-10-CM | POA: Diagnosis not present

## 2021-04-14 DIAGNOSIS — M9903 Segmental and somatic dysfunction of lumbar region: Secondary | ICD-10-CM | POA: Diagnosis not present

## 2021-04-16 DIAGNOSIS — M9903 Segmental and somatic dysfunction of lumbar region: Secondary | ICD-10-CM | POA: Diagnosis not present

## 2021-04-16 DIAGNOSIS — Z79890 Hormone replacement therapy (postmenopausal): Secondary | ICD-10-CM | POA: Diagnosis not present

## 2021-04-16 DIAGNOSIS — M810 Age-related osteoporosis without current pathological fracture: Secondary | ICD-10-CM | POA: Diagnosis not present

## 2021-04-16 DIAGNOSIS — M5136 Other intervertebral disc degeneration, lumbar region: Secondary | ICD-10-CM | POA: Diagnosis not present

## 2021-04-16 DIAGNOSIS — E291 Testicular hypofunction: Secondary | ICD-10-CM | POA: Diagnosis not present

## 2021-04-23 DIAGNOSIS — M9903 Segmental and somatic dysfunction of lumbar region: Secondary | ICD-10-CM | POA: Diagnosis not present

## 2021-04-23 DIAGNOSIS — M5136 Other intervertebral disc degeneration, lumbar region: Secondary | ICD-10-CM | POA: Diagnosis not present

## 2021-04-28 DIAGNOSIS — M9903 Segmental and somatic dysfunction of lumbar region: Secondary | ICD-10-CM | POA: Diagnosis not present

## 2021-04-28 DIAGNOSIS — M5136 Other intervertebral disc degeneration, lumbar region: Secondary | ICD-10-CM | POA: Diagnosis not present

## 2021-04-30 DIAGNOSIS — M5136 Other intervertebral disc degeneration, lumbar region: Secondary | ICD-10-CM | POA: Diagnosis not present

## 2021-04-30 DIAGNOSIS — M9903 Segmental and somatic dysfunction of lumbar region: Secondary | ICD-10-CM | POA: Diagnosis not present

## 2021-05-05 DIAGNOSIS — M5136 Other intervertebral disc degeneration, lumbar region: Secondary | ICD-10-CM | POA: Diagnosis not present

## 2021-05-05 DIAGNOSIS — M9903 Segmental and somatic dysfunction of lumbar region: Secondary | ICD-10-CM | POA: Diagnosis not present

## 2021-05-07 DIAGNOSIS — M9903 Segmental and somatic dysfunction of lumbar region: Secondary | ICD-10-CM | POA: Diagnosis not present

## 2021-05-07 DIAGNOSIS — M5136 Other intervertebral disc degeneration, lumbar region: Secondary | ICD-10-CM | POA: Diagnosis not present

## 2021-05-12 DIAGNOSIS — M9903 Segmental and somatic dysfunction of lumbar region: Secondary | ICD-10-CM | POA: Diagnosis not present

## 2021-05-12 DIAGNOSIS — M5136 Other intervertebral disc degeneration, lumbar region: Secondary | ICD-10-CM | POA: Diagnosis not present

## 2021-05-14 DIAGNOSIS — M9903 Segmental and somatic dysfunction of lumbar region: Secondary | ICD-10-CM | POA: Diagnosis not present

## 2021-05-14 DIAGNOSIS — M5136 Other intervertebral disc degeneration, lumbar region: Secondary | ICD-10-CM | POA: Diagnosis not present

## 2021-05-16 ENCOUNTER — Other Ambulatory Visit (HOSPITAL_COMMUNITY): Payer: Self-pay

## 2021-05-18 ENCOUNTER — Other Ambulatory Visit (HOSPITAL_COMMUNITY): Payer: Self-pay

## 2021-05-18 MED ORDER — AMLODIPINE BESYLATE 2.5 MG PO TABS
ORAL_TABLET | ORAL | 2 refills | Status: DC
  Filled 2021-05-18: qty 90, 90d supply, fill #0
  Filled 2021-08-14: qty 90, 90d supply, fill #1

## 2021-05-18 MED ORDER — AMLODIPINE BESYLATE 2.5 MG PO TABS
ORAL_TABLET | ORAL | 2 refills | Status: DC
  Filled 2021-05-18: qty 90, 90d supply, fill #0

## 2021-05-18 MED ORDER — METFORMIN HCL ER 500 MG PO TB24: ORAL_TABLET | ORAL | 2 refills | Status: DC

## 2021-05-18 MED ORDER — LISINOPRIL-HYDROCHLOROTHIAZIDE 20-12.5 MG PO TABS
ORAL_TABLET | ORAL | 2 refills | Status: DC
  Filled 2021-08-14: qty 180, 90d supply, fill #0

## 2021-05-19 ENCOUNTER — Other Ambulatory Visit (HOSPITAL_COMMUNITY): Payer: Self-pay

## 2021-05-19 DIAGNOSIS — M5136 Other intervertebral disc degeneration, lumbar region: Secondary | ICD-10-CM | POA: Diagnosis not present

## 2021-05-19 DIAGNOSIS — M9903 Segmental and somatic dysfunction of lumbar region: Secondary | ICD-10-CM | POA: Diagnosis not present

## 2021-05-20 ENCOUNTER — Other Ambulatory Visit (HOSPITAL_COMMUNITY): Payer: Self-pay

## 2021-05-26 DIAGNOSIS — M9903 Segmental and somatic dysfunction of lumbar region: Secondary | ICD-10-CM | POA: Diagnosis not present

## 2021-05-26 DIAGNOSIS — M5136 Other intervertebral disc degeneration, lumbar region: Secondary | ICD-10-CM | POA: Diagnosis not present

## 2021-06-03 ENCOUNTER — Other Ambulatory Visit (HOSPITAL_COMMUNITY): Payer: Self-pay

## 2021-06-16 DIAGNOSIS — M9903 Segmental and somatic dysfunction of lumbar region: Secondary | ICD-10-CM | POA: Diagnosis not present

## 2021-06-16 DIAGNOSIS — M5136 Other intervertebral disc degeneration, lumbar region: Secondary | ICD-10-CM | POA: Diagnosis not present

## 2021-06-23 DIAGNOSIS — M5136 Other intervertebral disc degeneration, lumbar region: Secondary | ICD-10-CM | POA: Diagnosis not present

## 2021-06-23 DIAGNOSIS — M9903 Segmental and somatic dysfunction of lumbar region: Secondary | ICD-10-CM | POA: Diagnosis not present

## 2021-06-25 ENCOUNTER — Other Ambulatory Visit (HOSPITAL_COMMUNITY): Payer: Self-pay

## 2021-07-23 DIAGNOSIS — M9903 Segmental and somatic dysfunction of lumbar region: Secondary | ICD-10-CM | POA: Diagnosis not present

## 2021-07-23 DIAGNOSIS — M5136 Other intervertebral disc degeneration, lumbar region: Secondary | ICD-10-CM | POA: Diagnosis not present

## 2021-07-29 ENCOUNTER — Other Ambulatory Visit (HOSPITAL_COMMUNITY): Payer: Self-pay

## 2021-08-03 DIAGNOSIS — M810 Age-related osteoporosis without current pathological fracture: Secondary | ICD-10-CM | POA: Diagnosis not present

## 2021-08-03 DIAGNOSIS — E291 Testicular hypofunction: Secondary | ICD-10-CM | POA: Diagnosis not present

## 2021-08-03 DIAGNOSIS — Z79890 Hormone replacement therapy (postmenopausal): Secondary | ICD-10-CM | POA: Diagnosis not present

## 2021-08-06 ENCOUNTER — Other Ambulatory Visit (HOSPITAL_COMMUNITY): Payer: Self-pay

## 2021-08-06 MED ORDER — METFORMIN HCL ER 500 MG PO TB24
ORAL_TABLET | ORAL | 11 refills | Status: DC
  Filled 2021-08-06: qty 60, 30d supply, fill #0
  Filled 2021-09-12: qty 60, 30d supply, fill #1

## 2021-08-14 ENCOUNTER — Other Ambulatory Visit (HOSPITAL_COMMUNITY): Payer: Self-pay

## 2021-08-19 ENCOUNTER — Other Ambulatory Visit (HOSPITAL_COMMUNITY): Payer: Self-pay

## 2021-08-26 DIAGNOSIS — M5136 Other intervertebral disc degeneration, lumbar region: Secondary | ICD-10-CM | POA: Diagnosis not present

## 2021-08-26 DIAGNOSIS — M9903 Segmental and somatic dysfunction of lumbar region: Secondary | ICD-10-CM | POA: Diagnosis not present

## 2021-08-29 ENCOUNTER — Other Ambulatory Visit (HOSPITAL_COMMUNITY): Payer: Self-pay

## 2021-08-31 ENCOUNTER — Other Ambulatory Visit (HOSPITAL_COMMUNITY): Payer: Self-pay

## 2021-08-31 MED ORDER — TADALAFIL 10 MG PO TABS
ORAL_TABLET | ORAL | 11 refills | Status: DC
  Filled 2021-08-31: qty 30, 15d supply, fill #0
  Filled 2021-09-22: qty 30, 15d supply, fill #1
  Filled 2021-10-04: qty 30, 15d supply, fill #2
  Filled 2021-10-26: qty 26, 13d supply, fill #3
  Filled 2021-10-28: qty 4, 2d supply, fill #3
  Filled 2021-11-16: qty 30, 15d supply, fill #4
  Filled 2022-01-21: qty 30, 15d supply, fill #5
  Filled 2022-01-31 – 2022-02-02 (×2): qty 30, 15d supply, fill #6

## 2021-09-01 ENCOUNTER — Other Ambulatory Visit (HOSPITAL_COMMUNITY): Payer: Self-pay

## 2021-09-10 DIAGNOSIS — E7211 Homocystinuria: Secondary | ICD-10-CM | POA: Diagnosis not present

## 2021-09-10 DIAGNOSIS — E559 Vitamin D deficiency, unspecified: Secondary | ICD-10-CM | POA: Diagnosis not present

## 2021-09-10 DIAGNOSIS — Z79890 Hormone replacement therapy (postmenopausal): Secondary | ICD-10-CM | POA: Diagnosis not present

## 2021-09-10 DIAGNOSIS — M810 Age-related osteoporosis without current pathological fracture: Secondary | ICD-10-CM | POA: Diagnosis not present

## 2021-09-10 DIAGNOSIS — D519 Vitamin B12 deficiency anemia, unspecified: Secondary | ICD-10-CM | POA: Diagnosis not present

## 2021-09-10 DIAGNOSIS — R739 Hyperglycemia, unspecified: Secondary | ICD-10-CM | POA: Diagnosis not present

## 2021-09-10 DIAGNOSIS — E291 Testicular hypofunction: Secondary | ICD-10-CM | POA: Diagnosis not present

## 2021-09-10 LAB — HEMOGLOBIN A1C: Hemoglobin A1C: 7

## 2021-09-14 ENCOUNTER — Other Ambulatory Visit (HOSPITAL_COMMUNITY): Payer: Self-pay

## 2021-09-15 ENCOUNTER — Other Ambulatory Visit (HOSPITAL_COMMUNITY): Payer: Self-pay

## 2021-09-21 ENCOUNTER — Encounter: Payer: Self-pay | Admitting: Family Medicine

## 2021-09-22 ENCOUNTER — Other Ambulatory Visit (HOSPITAL_COMMUNITY): Payer: Self-pay

## 2021-09-22 DIAGNOSIS — M9903 Segmental and somatic dysfunction of lumbar region: Secondary | ICD-10-CM | POA: Diagnosis not present

## 2021-09-22 DIAGNOSIS — M5136 Other intervertebral disc degeneration, lumbar region: Secondary | ICD-10-CM | POA: Diagnosis not present

## 2021-09-23 ENCOUNTER — Encounter: Payer: Self-pay | Admitting: Family Medicine

## 2021-09-23 ENCOUNTER — Other Ambulatory Visit (HOSPITAL_COMMUNITY): Payer: Self-pay

## 2021-09-23 ENCOUNTER — Ambulatory Visit: Payer: 59 | Admitting: Family Medicine

## 2021-09-23 VITALS — BP 117/73 | HR 75 | Temp 98.3°F | Ht 75.0 in | Wt 255.0 lb

## 2021-09-23 DIAGNOSIS — K635 Polyp of colon: Secondary | ICD-10-CM

## 2021-09-23 DIAGNOSIS — D582 Other hemoglobinopathies: Secondary | ICD-10-CM | POA: Diagnosis not present

## 2021-09-23 DIAGNOSIS — I77810 Thoracic aortic ectasia: Secondary | ICD-10-CM

## 2021-09-23 DIAGNOSIS — I10 Essential (primary) hypertension: Secondary | ICD-10-CM | POA: Diagnosis not present

## 2021-09-23 DIAGNOSIS — I5189 Other ill-defined heart diseases: Secondary | ICD-10-CM | POA: Diagnosis not present

## 2021-09-23 DIAGNOSIS — G609 Hereditary and idiopathic neuropathy, unspecified: Secondary | ICD-10-CM

## 2021-09-23 DIAGNOSIS — R7989 Other specified abnormal findings of blood chemistry: Secondary | ICD-10-CM

## 2021-09-23 DIAGNOSIS — Z23 Encounter for immunization: Secondary | ICD-10-CM

## 2021-09-23 DIAGNOSIS — Z8249 Family history of ischemic heart disease and other diseases of the circulatory system: Secondary | ICD-10-CM

## 2021-09-23 DIAGNOSIS — Z79890 Hormone replacement therapy (postmenopausal): Secondary | ICD-10-CM | POA: Diagnosis not present

## 2021-09-23 DIAGNOSIS — Z7689 Persons encountering health services in other specified circumstances: Secondary | ICD-10-CM | POA: Diagnosis not present

## 2021-09-23 DIAGNOSIS — K219 Gastro-esophageal reflux disease without esophagitis: Secondary | ICD-10-CM

## 2021-09-23 DIAGNOSIS — E1169 Type 2 diabetes mellitus with other specified complication: Secondary | ICD-10-CM

## 2021-09-23 DIAGNOSIS — E669 Obesity, unspecified: Secondary | ICD-10-CM

## 2021-09-23 DIAGNOSIS — E785 Hyperlipidemia, unspecified: Secondary | ICD-10-CM | POA: Diagnosis not present

## 2021-09-23 DIAGNOSIS — E66811 Obesity, class 1: Secondary | ICD-10-CM

## 2021-09-23 MED ORDER — METFORMIN HCL ER 500 MG PO TB24
1000.0000 mg | ORAL_TABLET | Freq: Every day | ORAL | 1 refills | Status: DC
Start: 2021-09-23 — End: 2022-02-12
  Filled 2021-09-23: qty 180, fill #0
  Filled 2021-10-04 – 2021-10-09 (×2): qty 180, 90d supply, fill #0
  Filled 2022-01-21: qty 180, 90d supply, fill #1

## 2021-09-23 MED ORDER — LISINOPRIL-HYDROCHLOROTHIAZIDE 20-12.5 MG PO TABS
2.0000 | ORAL_TABLET | Freq: Every day | ORAL | 1 refills | Status: DC
  Filled 2021-09-23: qty 180, fill #0
  Filled 2021-10-04 – 2021-10-26 (×4): qty 180, 90d supply, fill #0
  Filled 2022-01-21: qty 180, 90d supply, fill #1

## 2021-09-23 MED ORDER — AMLODIPINE BESYLATE 2.5 MG PO TABS
2.5000 mg | ORAL_TABLET | Freq: Every day | ORAL | 1 refills | Status: DC
Start: 2021-09-23 — End: 2022-02-12
  Filled 2021-09-23: qty 90, fill #0
  Filled 2021-10-04 – 2021-10-26 (×4): qty 90, 90d supply, fill #0
  Filled 2022-01-21: qty 90, 90d supply, fill #1

## 2021-09-23 NOTE — Patient Instructions (Addendum)
Return in about 5 months (around 02/12/2022) for cpe (20 min), Routine chronic condition follow-up.        Great to see you today.  I have refilled the medication(s) we provide.   If labs were collected, we will inform you of lab results once received either by echart message or telephone call.   - echart message- for normal results that have been seen by the patient already.   - telephone call: abnormal results or if patient has not viewed results in their echart.

## 2021-09-23 NOTE — Progress Notes (Signed)
Patient ID: Stephen Dorsey, male  DOB: 21-Jun-1960, 61 y.o.   MRN: [id number] Patient Care Team    Relationship Specialty Notifications Start End  Ma Hillock, DO PCP - General Family Medicine  09/23/21   Carol Ada, MD Consulting Physician Gastroenterology  09/23/21   Curt Jews, OD  Optometry  09/23/21   Jamesetta Geralds, [place]  Chiropractic Medicine  09/23/21   Penni Bombard, MD Consulting Physician Neurology  09/23/21   Gaynelle Cage, MD    09/23/21    Comment: Functional med  Sheryn Bison, MD Referring Physician Dermatology  09/24/21     Chief Complaint  Patient presents with   Establish Care   Diabetes    A1c 7.0; 09/10/21    Subjective: Stephen Dorsey is a 62 y.o.  male present for new patient establishment/[facility] All past medical history, surgical history, allergies, family history, immunizations, medications and social history were updated in the electronic medical record today. All recent labs, ED visits and hospitalizations within the last year were reviewed.  Hypertension/HLD/h/o abnormal stress test (2015)/obesity/diastolic dysfunction-grade 1/aortic root dilatation-mild Pt reports compliance with lisinopril- HCTZ 40-25.  Patient denies chest pain, shortness of breath or lower extremity edema. Pt does not take a daily baby ASA. Pt is not prescribed statin. RF: HTN, obesity, DM, FH heart disease, abnormal stress test  Myocardial perfusion image 4/8//2015: Exercise Capacity:  Good exercise capacity. BP Response:  Hypotensive blood pressure response. Clinical Symptoms:  There is dyspnea. ECG Impression:  There are scattered PVCs. Comparison with Prior Nuclear Study: No previous nuclear study performed Overall Impression:  Intermediate risk stress nuclear study with moderate-sized area of partially reversible inferoseptal ischemia (SDS 5, Extent 7%) There was a hypotensive response to exercise. PVC's were noted, but no diagnostic ST segment depression was  appreciated.] LV Wall Motion:  EF 63%, basal inferoseptal hypokinesis.  Echocardiogram 03/20/2013: - Left ventricle: The cavity size was mildly dilated. Wall    thickness was normal. Systolic function was normal. The    estimated ejection fraction was in the range of 55% to    60%. Wall motion was normal; there were no regional wall    motion abnormalities. Doppler parameters are consistent    with abnormal left ventricular relaxation (grade 1    diastolic dysfunction).  - Aortic valve: Trivial regurgitation.  - Aortic root: The aortic root was mildly dilated.  - Left atrium: The atrium was mildly dilated.   Lower extremity arterial Doppler 02/22/2013: Normal bilaterally  Diabetes/HLD/obesity Pt reports compliance with metformin 500 XR BID. Denies NEW numbness, tingling of extremities, hypo/hyperglycemic events or non-healing wounds.  Has a history of idiopathic small fiber neuropathy of his lower extremities.  She reports he has always been a prediabetic, until recently his A1c increased to 7.0.  He is prescribed metformin 500 mg XR twice daily.  He reports compliance with his medication.  He states that he stopped watching his diet over the summertime and that is when his A1c went up to 7.0.  Low testosterone/warm replacement therapy/elevated hemoglobin-secondary polycythemia Managed by functional med-pellet hormone therapy.  He reports he has phlebotomies when his hemoglobin becomes elevated.  Idiopathic small fiber peripheral neuropathy Has established with neurology in 2019. 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  Neuropathy affects his knees to feet.      09/23/2021    8:42 AM  Depression screen PHQ 2/9  Decreased Interest 0  Down, Depressed,  Hopeless 0  PHQ - 2 Score 0       No data to display                03/31/2016    2:27 PM  [place] in the past year? No    Immunization History  Administered  Date(s) Administered   PFIZER(Purple Top)SARS-COV-2 Vaccination 05/10/2019, 06/04/2019   Tdap 07/03/2014   Zoster Recombinat (Shingrix) 01/08/2020, 03/14/2020    No results found.  Past Medical History:  Diagnosis Date   Cataract 01/11/2021   Very slight imparement   Colon polyps    By colonoscopy in 2009    Decreased pedal pulses    Diabetes (HCC)    GERD (gastroesophageal reflux disease) 01/11/2021   Occasionally   HLD (hyperlipidemia)    Hypertension    Idiopathic small fiber sensory neuropathy 2008   Dx in 2008. Getting worse, has trouble feeling his feet. No Sx's in arms.   Kidney stone 1998   Low testosterone    Pneumonia due to COVID-19 virus 02/17/2019   No Known Allergies Past Surgical History:  Procedure Laterality Date   KIDNEY STONE SURGERY     Family History  Problem Relation Age of Onset   Thyroid disease Mother        thyroid problems   Heart attack Mother    Hypertension Father    Hyperlipidemia Father    Lung cancer Father    Thyroid disease Sister    Arthritis Sister    Heart attack Maternal Grandmother    Gaucher's disease Maternal Grandmother    Heart attack Maternal Grandfather    Diabetes Maternal Grandfather    Hearing loss Maternal Grandfather    Arthritis Paternal Grandmother    Hearing loss Paternal Grandmother    Heart attack Paternal Grandmother    Heart attack Paternal Grandfather    Social History   Social History Narrative   Marital status/children/pets: Married.   Education/employment: Works for [employer].  Education 4 yr college.  B[employer].   Safety:      -smoke alarm in the home:Yes     - wears seatbelt: Yes     - Feels safe in their relationships: Yes       Allergies as of 09/23/2021   No Known Allergies      Medication List        Accurate as of September 23, 2021 11:59 PM. If you have any questions, ask your nurse or doctor.          STOP taking these medications    aspirin EC 81 MG tablet Stopped by:  Howard Pouch, DO   b complex vitamins tablet Stopped by: Howard Pouch, DO   Carestart COVID-19 Home Test Kit Generic drug: COVID-19 At Home Antigen Test Stopped by: Howard Pouch, DO   dexamethasone 6 MG tablet Commonly known as: DECADRON Stopped by: Howard Pouch, DO   FISH OIL PO Stopped by: Howard Pouch, DO   lisinopril 20 MG tablet Commonly known as: ZESTRIL Stopped by: Howard Pouch, DO       TAKE these medications    amLODipine 2.5 MG tablet Commonly known as: NORVASC Take 1 tablet by mouth once daily What changed: Another medication with the same name was removed. Continue taking this medication, and follow the directions you see here. Changed by: Howard Pouch, DO   folic acid 735 MCG tablet Commonly known as: FOLVITE Take 400 mcg by mouth daily.   lisinopril-hydrochlorothiazide 20-12.5 MG tablet  Commonly known as: ZESTORETIC Take 2 tablets by mouth once a day What changed: Another medication with the same name was removed. Continue taking this medication, and follow the directions you see here. Changed by: Howard Pouch, DO   Magnesium 500 MG Caps Take 500 mg by mouth daily.   metFORMIN 500 MG 24 hr tablet Commonly known as: GLUCOPHAGE-XR Take 2 tablets by mouth once a day with dinner What changed: Another medication with the same name was removed. Continue taking this medication, and follow the directions you see here. Changed by: Howard Pouch, DO   Potassium 99 MG Tabs Take 99 mg by mouth daily. Daily   tadalafil 10 MG tablet Commonly known as: Cialis Take 1 tablet by mouth daily as needed and take 1 - 2 tablets as needed prior to sexual activity   Vitamin B-12 5000 MCG Tbdp Take 1 tablet by mouth daily.   VITAMIN C PO Take 1 tablet by mouth daily.   VITAMIN D (CHOLECALCIFEROL) PO Take 1 tablet by mouth daily.        All past medical history, surgical history, allergies, family history, immunizations andmedications were updated in the EMR  today and reviewed under the history and medication portions of their EMR.    Recent Results (from the past 2160 hour(s))  Hemoglobin A1c     Status: None   Collection Time: 09/10/21 12:00 AM  Result Value Ref Range   Hemoglobin A1C 7     No results found.   ROS 14 pt review of systems performed and negative (unless mentioned in an HPI)  Objective: BP 117/73   Pulse 75   Temp 98.3 F (36.8 C) (Oral)   Ht $R'6\' 3"'FB$  (1.905 m)   Wt 255 lb (115.7 kg)   SpO2 98%   BMI 31.87 kg/m  Physical Exam Vitals and nursing note reviewed.  Constitutional:      General: He is not in acute distress.    Appearance: Normal appearance. He is obese. He is not ill-appearing, toxic-appearing or diaphoretic.  HENT:     Head: Normocephalic and atraumatic.  Eyes:     General: No scleral icterus.       Right eye: No discharge.        Left eye: No discharge.     Extraocular Movements: Extraocular movements intact.     Pupils: Pupils are equal, round, and reactive to light.  Cardiovascular:     Rate and Rhythm: Normal rate and regular rhythm.     Heart sounds: No murmur heard. Pulmonary:     Effort: Pulmonary effort is normal. No respiratory distress.     Breath sounds: Normal breath sounds. No wheezing, rhonchi or rales.  Musculoskeletal:     Right lower leg: No edema.     Left lower leg: No edema.  Skin:    General: Skin is warm and dry.     Coloration: Skin is not jaundiced or pale.     Findings: No rash.  Neurological:     Mental Status: He is alert and oriented to person, place, and time. Mental status is at baseline.  Psychiatric:        Mood and Affect: Mood normal.        Behavior: Behavior normal.        Thought Content: Thought content normal.        Judgment: Judgment normal.      Diabetic Foot Exam - Simple   Simple Foot Form Diabetic Foot exam was performed  with the following findings: Yes 09/23/2021  8:50 AM  Visual Inspection No deformities, no ulcerations, no other skin  breakdown bilaterally: Yes Sensation Testing Intact to touch and monofilament testing bilaterally: Yes Pulse Check Posterior Tibialis and Dorsalis pulse intact bilaterally: Yes Comments      Assessment/plan: Stephen Dorsey is a 61 y.o. male present for est care/[facility] Establishing care with new doctor, encounter for Type 2 diabetes mellitus with hyperlipidemia ([place]) Diabetes has been stable.  He has had prediabetes for many years, and A1c's have been in the 5-6 range on metformin. Last A1c 7.0 about 1 month ago. Continue metformin 500 XR twice daily Diabetic diet Routine exercise - Microalbumin / creatinine urine ratio -Encourage influenza vaccine yearly-declined today -Waiting on full records, would encourage pneumonia vaccine, if not already received. -Foot exam completed 09/23/2021 -Eye exam completed 09/10/2021 -Reviewed recent CMP/GFR from last month from [facility]-abstracted  Primary hypertension/history of heart disease/abnormal stress test (2330)/QTMAUQJ/FHLKT 1 diastolic dysfunction/aortic root dilatation mild We discussed the benefits of statin group today.  He is hesitant to start a statin because his father had myalgias with statin.  He has a significant family history of heart disease with 5 members of his family dying of heart attack.  We discussed CT cardiac scoring and he would like to have this completed. Continue amlodipine 2.5 mg daily. Continue lisinopril-HCTZ 40-25 mg daily. Recommend heart healthy diet and routine exercise. I do recommend a statin-he is hesitant.  His cholesterol/LDL was recently tested and had been 111.  He would likely benefit from a very low-dose statin 2-3 times weekly to receive the cardiovascular benefits.  He will think about this. - Had seen Dr. Skains-cardiology many years ago, has not been seen since 2015. - Microalbumin / creatinine urine ratio - CT CARDIAC SCORING (SELF PAY ONLY); Future  Low testosterone/hormone replacement/elevated  hemoglobin Managed by functional med-pellet hormone Bated hemoglobin and hematocrit on labs he brings in with him from [facility].  He is aware of this already and has occasional therapeutic phlebotomy when levels are high..  Need for immunization against influenza Declined  Idiopathic small fiber peripheral neuropathy Established in the past with neurology.  Has not been seen in some time. 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  Neuropathy affects his knees to feet. Briefly discussed the use of Lyrica and Cymbalta today.  Patient will consider.   Return in about 5 months (around 02/12/2022) for cpe (20 min), Routine chronic condition follow-up.  Orders Placed This Encounter  Procedures   CT CARDIAC SCORING (SELF PAY ONLY)   Hemoglobin A1c   Microalbumin / creatinine urine ratio   Meds ordered this encounter  Medications   amLODipine (NORVASC) 2.5 MG tablet    Sig: Take 1 tablet by mouth once daily    Dispense:  90 tablet    Refill:  1   lisinopril-hydrochlorothiazide (ZESTORETIC) 20-12.5 MG tablet    Sig: Take 2 tablets by mouth once a day    Dispense:  180 tablet    Refill:  1   metFORMIN (GLUCOPHAGE-XR) 500 MG 24 hr tablet    Sig: Take 2 tablets by mouth once a day with dinner    Dispense:  180 tablet    Refill:  1   Referral Orders  No referral(s) requested today     Note is dictated utilizing voice recognition software. Although note has been proof read prior to signing, occasional typographical errors still can be missed. If any  questions arise, please do not hesitate to call for verification.  Electronically signed by: Howard Pouch, DO [place]

## 2021-09-24 ENCOUNTER — Encounter: Payer: Self-pay | Admitting: Family Medicine

## 2021-09-24 ENCOUNTER — Telehealth: Payer: Self-pay | Admitting: Family Medicine

## 2021-09-24 DIAGNOSIS — I77810 Thoracic aortic ectasia: Secondary | ICD-10-CM | POA: Insufficient documentation

## 2021-09-24 DIAGNOSIS — D582 Other hemoglobinopathies: Secondary | ICD-10-CM | POA: Insufficient documentation

## 2021-09-24 DIAGNOSIS — I10 Essential (primary) hypertension: Secondary | ICD-10-CM | POA: Insufficient documentation

## 2021-09-24 DIAGNOSIS — Z8249 Family history of ischemic heart disease and other diseases of the circulatory system: Secondary | ICD-10-CM | POA: Insufficient documentation

## 2021-09-24 DIAGNOSIS — G609 Hereditary and idiopathic neuropathy, unspecified: Secondary | ICD-10-CM | POA: Insufficient documentation

## 2021-09-24 DIAGNOSIS — K635 Polyp of colon: Secondary | ICD-10-CM | POA: Insufficient documentation

## 2021-09-24 DIAGNOSIS — Z79890 Hormone replacement therapy (postmenopausal): Secondary | ICD-10-CM | POA: Insufficient documentation

## 2021-09-24 DIAGNOSIS — R7989 Other specified abnormal findings of blood chemistry: Secondary | ICD-10-CM | POA: Insufficient documentation

## 2021-09-24 DIAGNOSIS — E669 Obesity, unspecified: Secondary | ICD-10-CM | POA: Insufficient documentation

## 2021-09-24 DIAGNOSIS — I5189 Other ill-defined heart diseases: Secondary | ICD-10-CM | POA: Insufficient documentation

## 2021-09-24 LAB — MICROALBUMIN / CREATININE URINE RATIO
Creatinine,U: 141.3 mg/dL
Microalb Creat Ratio: 0.9 mg/g (ref 0.0–30.0)
Microalb, Ur: 1.3 mg/dL (ref 0.0–1.9)

## 2021-09-24 NOTE — Telephone Encounter (Signed)
Pt was inform they will call them and cb in 2 weeks if no one calls at that time

## 2021-09-24 NOTE — Telephone Encounter (Signed)
Pt requested, if possible that someone can give him a call to explain CT Scan and what does he have to on his end. Asking does he have to wait for CT scan to call and make a appointment or does he have to call.

## 2021-10-05 ENCOUNTER — Other Ambulatory Visit (HOSPITAL_COMMUNITY): Payer: Self-pay

## 2021-10-06 ENCOUNTER — Other Ambulatory Visit (HOSPITAL_COMMUNITY): Payer: Self-pay

## 2021-10-10 ENCOUNTER — Other Ambulatory Visit (HOSPITAL_COMMUNITY): Payer: Self-pay

## 2021-10-12 ENCOUNTER — Other Ambulatory Visit (HOSPITAL_COMMUNITY): Payer: Self-pay

## 2021-10-14 NOTE — Telephone Encounter (Signed)
Chart updated

## 2021-10-23 ENCOUNTER — Encounter: Payer: Self-pay | Admitting: Family Medicine

## 2021-10-26 ENCOUNTER — Other Ambulatory Visit (HOSPITAL_COMMUNITY): Payer: Self-pay

## 2021-10-27 ENCOUNTER — Other Ambulatory Visit (HOSPITAL_COMMUNITY): Payer: Self-pay

## 2021-10-28 ENCOUNTER — Other Ambulatory Visit (HOSPITAL_COMMUNITY): Payer: Self-pay

## 2021-10-28 ENCOUNTER — Ambulatory Visit (HOSPITAL_BASED_OUTPATIENT_CLINIC_OR_DEPARTMENT_OTHER)
Admission: RE | Admit: 2021-10-28 | Discharge: 2021-10-28 | Disposition: A | Discharge status: Home / Self Care | Payer: 59 | Source: Ambulatory Visit | Attending: Family Medicine | Admitting: Family Medicine

## 2021-10-28 DIAGNOSIS — E1169 Type 2 diabetes mellitus with other specified complication: Secondary | ICD-10-CM | POA: Insufficient documentation

## 2021-10-28 DIAGNOSIS — M5136 Other intervertebral disc degeneration, lumbar region: Secondary | ICD-10-CM | POA: Diagnosis not present

## 2021-10-28 DIAGNOSIS — Z8249 Family history of ischemic heart disease and other diseases of the circulatory system: Secondary | ICD-10-CM | POA: Insufficient documentation

## 2021-10-28 DIAGNOSIS — I10 Essential (primary) hypertension: Secondary | ICD-10-CM | POA: Insufficient documentation

## 2021-10-28 DIAGNOSIS — E785 Hyperlipidemia, unspecified: Secondary | ICD-10-CM | POA: Insufficient documentation

## 2021-10-28 DIAGNOSIS — M9903 Segmental and somatic dysfunction of lumbar region: Secondary | ICD-10-CM | POA: Diagnosis not present

## 2021-10-29 ENCOUNTER — Other Ambulatory Visit (HOSPITAL_COMMUNITY): Payer: Self-pay

## 2021-10-29 DIAGNOSIS — W908XXS Exposure to other nonionizing radiation, sequela: Secondary | ICD-10-CM | POA: Diagnosis not present

## 2021-10-29 DIAGNOSIS — L738 Other specified follicular disorders: Secondary | ICD-10-CM | POA: Diagnosis not present

## 2021-10-29 DIAGNOSIS — D2371 Other benign neoplasm of skin of right lower limb, including hip: Secondary | ICD-10-CM | POA: Diagnosis not present

## 2021-10-29 DIAGNOSIS — X32XXXS Exposure to sunlight, sequela: Secondary | ICD-10-CM | POA: Diagnosis not present

## 2021-10-29 DIAGNOSIS — L578 Other skin changes due to chronic exposure to nonionizing radiation: Secondary | ICD-10-CM | POA: Diagnosis not present

## 2021-10-29 DIAGNOSIS — L918 Other hypertrophic disorders of the skin: Secondary | ICD-10-CM | POA: Diagnosis not present

## 2021-10-29 DIAGNOSIS — L570 Actinic keratosis: Secondary | ICD-10-CM | POA: Diagnosis not present

## 2021-10-29 DIAGNOSIS — L814 Other melanin hyperpigmentation: Secondary | ICD-10-CM | POA: Diagnosis not present

## 2021-10-29 DIAGNOSIS — M71342 Other bursal cyst, left hand: Secondary | ICD-10-CM | POA: Diagnosis not present

## 2021-10-30 ENCOUNTER — Telehealth: Payer: Self-pay | Admitting: Family Medicine

## 2021-10-30 ENCOUNTER — Other Ambulatory Visit (HOSPITAL_COMMUNITY): Payer: Self-pay

## 2021-10-30 MED ORDER — EZETIMIBE 10 MG PO TABS
10.0000 mg | ORAL_TABLET | Freq: Every day | ORAL | 3 refills | Status: DC
  Filled 2021-10-30 – 2021-11-16 (×2): qty 90, 90d supply, fill #0

## 2021-10-30 NOTE — Telephone Encounter (Signed)
Spoke with patient regarding results/recommendations.  

## 2021-10-30 NOTE — Addendum Note (Signed)
Addended by: Howard Pouch A on: 10/30/2021 02:24 PM   Modules accepted: Orders

## 2021-10-30 NOTE — Telephone Encounter (Signed)
Please inform patient Stephen Dorsey: Total: 52.8 Percentile: 50th  This Dorsey does mean that he has some plaque/cholesterol buildup in the small vessel surrounding his heart.  He is in the 50th percentile for age/gender etc.  This means he is at increased risk for cardiovascular event, but he is not at the highest risk being in the 50th percentile.  At this level it is recommended to start a statin medication to provide cardiovascular protection and keep LDL at goal, which for him is 70.  His last cholesterol had an LDL of 111.  If he is open to starting a low-dose statin, I will call this in for him we will recheck his cholesterol at his next appointment

## 2021-10-30 NOTE — Telephone Encounter (Signed)
Spoke with patient regarding results/recommendations. Pt does not want to do a stain medication. He states that he didn't do well ion statin before. Pt is okay to start something other than a statin

## 2021-10-30 NOTE — Telephone Encounter (Signed)
Zetia prescribed once daily

## 2021-11-12 ENCOUNTER — Other Ambulatory Visit (HOSPITAL_COMMUNITY): Payer: Self-pay

## 2021-11-16 ENCOUNTER — Other Ambulatory Visit (HOSPITAL_COMMUNITY): Payer: Self-pay

## 2021-11-17 ENCOUNTER — Other Ambulatory Visit (HOSPITAL_COMMUNITY): Payer: Self-pay

## 2021-11-17 DIAGNOSIS — M9903 Segmental and somatic dysfunction of lumbar region: Secondary | ICD-10-CM | POA: Diagnosis not present

## 2021-11-17 DIAGNOSIS — M5136 Other intervertebral disc degeneration, lumbar region: Secondary | ICD-10-CM | POA: Diagnosis not present

## 2021-12-02 DIAGNOSIS — E669 Obesity, unspecified: Secondary | ICD-10-CM | POA: Diagnosis not present

## 2021-12-02 DIAGNOSIS — E559 Vitamin D deficiency, unspecified: Secondary | ICD-10-CM | POA: Diagnosis not present

## 2021-12-02 DIAGNOSIS — E8881 Metabolic syndrome: Secondary | ICD-10-CM | POA: Diagnosis not present

## 2021-12-02 DIAGNOSIS — R739 Hyperglycemia, unspecified: Secondary | ICD-10-CM | POA: Diagnosis not present

## 2021-12-02 DIAGNOSIS — E119 Type 2 diabetes mellitus without complications: Secondary | ICD-10-CM | POA: Diagnosis not present

## 2021-12-15 DIAGNOSIS — M9903 Segmental and somatic dysfunction of lumbar region: Secondary | ICD-10-CM | POA: Diagnosis not present

## 2021-12-15 DIAGNOSIS — M5136 Other intervertebral disc degeneration, lumbar region: Secondary | ICD-10-CM | POA: Diagnosis not present

## 2022-01-18 DIAGNOSIS — E291 Testicular hypofunction: Secondary | ICD-10-CM | POA: Diagnosis not present

## 2022-01-18 DIAGNOSIS — Z79890 Hormone replacement therapy (postmenopausal): Secondary | ICD-10-CM | POA: Diagnosis not present

## 2022-01-18 LAB — BASIC METABOLIC PANEL
BUN: 23 — AB (ref 4–21)
CO2: 25 — AB (ref 13–22)
Chloride: 97 — AB (ref 99–108)
Creatinine: 1.5 — AB (ref 0.6–1.3)
Glucose: 102
Potassium: 4.3 mEq/L (ref 3.5–5.1)
Sodium: 137 (ref 137–147)

## 2022-01-18 LAB — PSA: PSA: 3.4

## 2022-01-18 LAB — CBC: RBC: 6.41 — AB (ref 3.87–5.11)

## 2022-01-18 LAB — LIPID PANEL
Cholesterol: 167 (ref 0–200)
HDL: 42 (ref 35–70)
LDL Cholesterol: 106
Triglycerides: 104 (ref 40–160)

## 2022-01-18 LAB — VITAMIN D 25 HYDROXY (VIT D DEFICIENCY, FRACTURES): Vit D, 25-Hydroxy: 58

## 2022-01-18 LAB — IRON,TIBC AND FERRITIN PANEL: Ferritin: 34

## 2022-01-18 LAB — CBC AND DIFFERENTIAL
HCT: 56 — AB (ref 41–53)
Hemoglobin: 18.4 — AB (ref 13.5–17.5)
Neutrophils Absolute: 4.9
Platelets: 163 10*3/uL (ref 150–400)
WBC: 7.3

## 2022-01-18 LAB — COMPREHENSIVE METABOLIC PANEL: Globulin: 2.2

## 2022-01-18 LAB — HEPATIC FUNCTION PANEL
ALT: 22 U/L (ref 10–40)
AST: 15 (ref 14–40)
Alkaline Phosphatase: 59 (ref 25–125)
Bilirubin, Total: 0.5

## 2022-01-18 LAB — TESTOSTERONE: Testosterone: 1015

## 2022-01-18 LAB — HEMOGLOBIN A1C: Hemoglobin A1C: 6.3

## 2022-02-01 ENCOUNTER — Other Ambulatory Visit (HOSPITAL_COMMUNITY): Payer: Self-pay

## 2022-02-15 ENCOUNTER — Encounter: Payer: Self-pay | Admitting: Family Medicine

## 2022-02-15 ENCOUNTER — Ambulatory Visit (INDEPENDENT_AMBULATORY_CARE_PROVIDER_SITE_OTHER): Payer: Commercial Managed Care - PPO | Admitting: Family Medicine

## 2022-02-15 ENCOUNTER — Other Ambulatory Visit (HOSPITAL_COMMUNITY): Payer: Self-pay

## 2022-02-15 VITALS — BP 134/72 | HR 88 | Temp 97.7°F | Ht 75.59 in | Wt 254.2 lb

## 2022-02-15 DIAGNOSIS — Z125 Encounter for screening for malignant neoplasm of prostate: Secondary | ICD-10-CM | POA: Diagnosis not present

## 2022-02-15 DIAGNOSIS — Z1211 Encounter for screening for malignant neoplasm of colon: Secondary | ICD-10-CM

## 2022-02-15 DIAGNOSIS — Z23 Encounter for immunization: Secondary | ICD-10-CM

## 2022-02-15 DIAGNOSIS — K635 Polyp of colon: Secondary | ICD-10-CM

## 2022-02-15 DIAGNOSIS — I5189 Other ill-defined heart diseases: Secondary | ICD-10-CM

## 2022-02-15 DIAGNOSIS — E1169 Type 2 diabetes mellitus with other specified complication: Secondary | ICD-10-CM

## 2022-02-15 DIAGNOSIS — Z79890 Hormone replacement therapy (postmenopausal): Secondary | ICD-10-CM

## 2022-02-15 DIAGNOSIS — Z8249 Family history of ischemic heart disease and other diseases of the circulatory system: Secondary | ICD-10-CM

## 2022-02-15 DIAGNOSIS — I10 Essential (primary) hypertension: Secondary | ICD-10-CM | POA: Diagnosis not present

## 2022-02-15 DIAGNOSIS — Z0000 Encounter for general adult medical examination without abnormal findings: Secondary | ICD-10-CM | POA: Diagnosis not present

## 2022-02-15 DIAGNOSIS — E785 Hyperlipidemia, unspecified: Secondary | ICD-10-CM

## 2022-02-15 DIAGNOSIS — I77810 Thoracic aortic ectasia: Secondary | ICD-10-CM | POA: Diagnosis not present

## 2022-02-15 DIAGNOSIS — D582 Other hemoglobinopathies: Secondary | ICD-10-CM | POA: Diagnosis not present

## 2022-02-15 MED ORDER — AMLODIPINE BESYLATE 2.5 MG PO TABS
2.5000 mg | ORAL_TABLET | Freq: Every day | ORAL | 1 refills | Status: DC
  Filled 2022-02-15 – 2022-04-07 (×2): qty 90, 90d supply, fill #0

## 2022-02-15 MED ORDER — METFORMIN HCL ER 500 MG PO TB24
1000.0000 mg | ORAL_TABLET | Freq: Every day | ORAL | 1 refills | Status: DC
  Filled 2022-02-15 – 2022-04-07 (×2): qty 180, 90d supply, fill #0

## 2022-02-15 MED ORDER — LISINOPRIL-HYDROCHLOROTHIAZIDE 20-12.5 MG PO TABS
2.0000 | ORAL_TABLET | Freq: Every day | ORAL | 1 refills | Status: DC
  Filled 2022-02-15 – 2022-04-07 (×2): qty 180, 90d supply, fill #0

## 2022-02-15 NOTE — Progress Notes (Signed)
Patient ID: Stephen Dorsey, male  DOB: [birth year], 62 y.o.   MRN: [id number] Patient Care Team    Relationship Specialty Notifications Start End  Ma Hillock, DO PCP - General Family Medicine  09/23/21   Carol Ada, MD Consulting Physician Gastroenterology  09/23/21   Curt Jews, OD  Optometry  09/23/21   Jamesetta Geralds, [place]  Chiropractic Medicine  09/23/21   Penni Bombard, MD Consulting Physician Neurology  09/23/21   Gaynelle Cage, MD    09/23/21    Comment: Functional med  Sheryn Bison, MD Referring Physician Dermatology  09/24/21     Chief Complaint  Patient presents with   Annual Exam    And cmc; pt is not fasting    Subjective:  Stephen Dorsey is a 62 y.o. male present for CPE and Chronic Conditions/illness Management  All past medical history, surgical history, allergies, family history, immunizations, medications and social history were updated in the electronic medical record today. All recent labs, ED visits and hospitalizations within the last year were reviewed.  Health maintenance:  Colonoscopy: last screen 08/2015, recommend follow up 7 yr;  Completed by Dr.Hung >due this yr- encouraged pt to schedule with Dr. Benson Norway.  Immunizations:  tdap UTD 2016, influenza declined, PNA series declined, Shingrix completed series Infectious disease screening: HIV completed, Hep C completed.  PSA: No results found for: "PSA", pt was counseled on prostate cancer screenings.  No family history.  PSA collected today Patient has a Dental home. Hospitalizations/ED visits: reviewed  Diabetes/HLD/obesity Pt reports compliance with metformin 500 XR BID.  Patient denies dizziness, hyperglycemic or hypoglycemic events. Patient denies new numbness, tingling in the extremities or nonhealing wounds of feet.  The has a history of idiopathic small fiber neuropathy of his lower extremities.    Low testosterone/warm replacement therapy/elevated hemoglobin-secondary  polycythemia Managed by functional med-pellet hormone therapy.  He reports he has phlebotomies when his hemoglobin becomes elevated.  Idiopathic small fiber peripheral neuropathy Has established with neurology in 2019. 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  Neuropathy affects his knees to feet.  Hypertension/HLD/h/o abnormal stress test (2015)/obesity/diastolic dysfunction-grade 1/aortic root dilatation-mild Pt reports compliance with lisinopril- HCTZ 40-25.   Patient denies chest pain, shortness of breath, dizziness or lower extremity edema.  Pt does not take a daily baby ASA. Pt is not prescribed statin due to choice.  After cardiac CT completed he was agreeable to start Zetia. RF: HTN, obesity, DM, FH heart disease, abnormal stress test/cardiac CT score 52.8/50th percentile  STUDIES: Cardiac CT 2023: FINDINGS:  Coronary Calcium Score:  Left main: 0  Left anterior descending artery: 33.6  Left circumflex artery: 19.2  Right coronary artery: 0  Total: 52.8  Percentile: 50th   Myocardial perfusion image 4/8//2015: Exercise Capacity:  Good exercise capacity. BP Response:  Hypotensive blood pressure response. Clinical Symptoms:  There is dyspnea. ECG Impression:  There are scattered PVCs. Comparison with Prior Nuclear Study: No previous nuclear study performed Overall Impression:  Intermediate risk stress nuclear study with moderate-sized area of partially reversible inferoseptal ischemia (SDS 5, Extent 7%) There was a hypotensive response to exercise. PVC's were noted, but no diagnostic ST segment depression was appreciated.] LV Wall Motion:  EF 63%, basal inferoseptal hypokinesis.  Echocardiogram 03/20/2013: - Left ventricle: The cavity size was mildly dilated. Wall    thickness was normal. Systolic function was normal. The    estimated ejection fraction was in the  range of 55% to    60%. Wall motion was normal; there were no  regional wall    motion abnormalities. Doppler parameters are consistent    with abnormal left ventricular relaxation (grade 1    diastolic dysfunction).  - Aortic valve: Trivial regurgitation.  - Aortic root: The aortic root was mildly dilated.  - Left atrium: The atrium was mildly dilated.   Lower extremity arterial Doppler 02/22/2013: Normal bilaterally      09/23/2021    8:42 AM  Depression screen PHQ 2/9  Decreased Interest 0  Down, Depressed, Hopeless 0  PHQ - 2 Score 0       No data to display                03/31/2016    2:27 PM  [place] in the past year? No    Immunization History  Administered Date(s) Administered   PFIZER(Purple Top)SARS-COV-2 Vaccination 05/10/2019, 06/04/2019   Tdap 07/03/2014   Zoster Recombinat (Shingrix) 01/08/2020, 03/14/2020   Past Medical History:  Diagnosis Date   Cataract 01/11/2021   Very slight imparement   Colon polyps    By colonoscopy in 2009    Decreased pedal pulses    Diabetes ([place])    GERD (gastroesophageal reflux disease) 01/11/2021   Occasionally   HLD (hyperlipidemia)    Hypertension    Idiopathic small fiber sensory neuropathy 2008   Dx in 2008. Getting worse, has trouble feeling his feet. No Sx's in arms.   Kidney stone 1998   Low testosterone    Pneumonia due to COVID-19 virus 02/17/2019   No Known Allergies Past Surgical History:  Procedure Laterality Date   KIDNEY STONE SURGERY     Family History  Problem Relation Age of Onset   Thyroid disease Mother        thyroid problems   Heart attack Mother    Hypertension Father    Hyperlipidemia Father    Lung cancer Father    Thyroid disease Sister    Arthritis Sister    Heart attack Maternal Grandmother    Gaucher's disease Maternal Grandmother    Heart attack Maternal Grandfather    Diabetes Maternal Grandfather    Hearing loss Maternal Grandfather    Arthritis Paternal Grandmother    Hearing loss Paternal Grandmother    Heart attack  Paternal Grandmother    Heart attack Paternal Grandfather    Social History   Social History Narrative   Marital status/children/pets: Married.   Education/employment: Works for [employer].  Education 4 yr college.  BSEE.   Safety:      -smoke alarm in the home:Yes     - wears seatbelt: Yes     - Feels safe in their relationships: Yes       Allergies as of 02/15/2022   No Known Allergies      Medication List        Accurate as of February 15, 2022  9:24 AM. If you have any questions, ask your nurse or doctor.          amLODipine 2.5 MG tablet Commonly known as: NORVASC Take 1 tablet (2.5 mg total) by mouth daily.   ezetimibe 10 MG tablet Commonly known as: Zetia Take 1 tablet (10 mg total) by mouth daily.   folic acid 841 MCG tablet Commonly known as: FOLVITE Take 400 mcg by mouth daily.   lisinopril-hydrochlorothiazide 20-12.5 MG tablet Commonly known as: ZESTORETIC Take 2 tablets by mouth daily.  Magnesium 500 MG Caps Take 500 mg by mouth daily.   metFORMIN 500 MG 24 hr tablet Commonly known as: GLUCOPHAGE-XR Take 2 tablets (1,000 mg total) by mouth daily with supper.   Potassium 99 MG Tabs Take 99 mg by mouth daily. Daily   tadalafil 10 MG tablet Commonly known as: Cialis Take 1 - 2 tablets as needed prior to sexual activity   Vitamin B-12 5000 MCG Tbdp Take 1 tablet by mouth daily.   VITAMIN C PO Take 1 tablet by mouth daily.   VITAMIN D (CHOLECALCIFEROL) PO Take 1 tablet by mouth daily.       All past medical history, surgical history, allergies, family history, immunizations andmedications were updated in the EMR today and reviewed under the history and medication portions of their EMR.     No results found for this or any previous visit (from the past 2160 hour(s)).  CT CARDIAC SCORING (SELF PAY ONLY) Addendum Date: 10/29/2021   FINDINGS:  Coronary Calcium Score:  Left main: 0  Left anterior descending artery: 33.6  Left  circumflex artery: 19.2  Right coronary artery: 0  Total: 52.8  Percentile: 50th  Pericardium: Normal. Aortic valve calcium score 2, trace calcifications. Ascending Aorta: Upper limit of normal caliber. Ascending aorta measures approximately 75m at the mid ascending aorta measured in a non-contrast axial plane. [facility]-cardiac: [facility]  IMPRESSION: Coronary calcium score of 52.8. This was 50th percentile for age-, race-, and sex-matched controls.   Result Date: 10/29/2021 IMPRESSION: 1. No acute findings in the imaged extracardiac chest. 2. Hepatic steatosis.   ROS 14 pt review of systems performed and negative (unless mentioned in an HPI)  Objective: BP 134/72   Pulse 88   Temp 97.7 F (36.5 C)   Ht 6' 3.59" (1.92 m)   Wt 254 lb 3.2 oz (115.3 kg)   SpO2 96%   BMI 31.28 kg/m  Physical Exam Constitutional:      General: He is not in acute distress.    Appearance: Normal appearance. He is not ill-appearing, toxic-appearing or diaphoretic.  HENT:     Head: Normocephalic and atraumatic.     Right Ear: Tympanic membrane, ear canal and external ear normal. There is no impacted cerumen.     Left Ear: Tympanic membrane, ear canal and external ear normal. There is no impacted cerumen.     Nose: Nose normal. No congestion or rhinorrhea.     Mouth/Throat:     Mouth: Mucous membranes are moist.     Pharynx: Oropharynx is clear. No oropharyngeal exudate or posterior oropharyngeal erythema.  Eyes:     General: No scleral icterus.       Right eye: No discharge.        Left eye: No discharge.     Extraocular Movements: Extraocular movements intact.     Pupils: Pupils are equal, round, and reactive to light.  Cardiovascular:     Rate and Rhythm: Normal rate and regular rhythm.     Pulses: Normal pulses.     Heart sounds: Normal heart sounds. No murmur heard.    No friction rub. No gallop.  Pulmonary:     Effort: Pulmonary effort is normal. No  respiratory distress.     Breath sounds: Normal breath sounds. No stridor. No wheezing, rhonchi or rales.  Chest:     Chest wall: No tenderness.  Abdominal:     General: Abdomen is flat. Bowel sounds are normal. There is no distension.  Palpations: Abdomen is soft. There is no mass.     Tenderness: There is no abdominal tenderness. There is no right CVA tenderness, left CVA tenderness, guarding or rebound.     Hernia: No hernia is present.  Musculoskeletal:        General: No swelling or tenderness. Normal range of motion.     Cervical back: Normal range of motion and neck supple.     Right lower leg: No edema.     Left lower leg: No edema.  Lymphadenopathy:     Cervical: No cervical adenopathy.  Skin:    General: Skin is warm and dry.     Coloration: Skin is not jaundiced.     Findings: No bruising, lesion or rash.  Neurological:     General: No focal deficit present.     Mental Status: He is alert and oriented to person, place, and time. Mental status is at baseline.     Cranial Nerves: No cranial nerve deficit.     Sensory: No sensory deficit.     Motor: No weakness.     Coordination: Coordination normal.     Gait: Gait normal.     Deep Tendon Reflexes: Reflexes normal.  Psychiatric:        Mood and Affect: Mood normal.        Behavior: Behavior normal.        Thought Content: Thought content normal.        Judgment: Judgment normal.     No results found.  Assessment/plan: Stephen Dorsey is a 62 y.o. male present for CPE and Chronic Conditions/illness Management Type 2 diabetes mellitus with hyperlipidemia ([place]) Continue metformin 500 XR twice daily Diabetic diet Routine exercise - Microalbumin: UTD 09/24/2021 -Encourage influenza vaccine yearly-declined today Pneumonia declined -Waiting on full records, would encourage pneumonia vaccine, if not already received. -Foot exam completed 09/23/2021 -Eye exam completed 09/10/2021- completed -A1c 6.3 >7.0> collected  today  Primary hypertension/history of heart disease/abnormal stress test (6045)/[id number]/JYNWG 1 diastolic dysfunction/aortic root dilatation mild We discussed the benefits of statin in detail.  He is hesitant to start a statin because his father had myalgias with statin.  He has a significant family history of heart disease with 5 members of his family dying of heart attack.  Cardiac CT returned 50th percentile and he was agreeable to start Zetia but not a statin. Continue amlodipine 2.5 mg daily. Continue lisinopril-HCTZ 40-25 mg daily. Continue Zetia CBC, CMP and TSH collected today - Had seen Dr. Andria Frames many years ago, has not been seen since 2015. - CT CARDIAC SCORING: 50th percentile  Low testosterone/hormone replacement/elevated hemoglobin Managed by functional med-pellet hormone He has elevated hemoglobin and hematocrit on labs he brings in with him from [facility] [place].  He is aware of this and has occasional therapeutic phlebotomy when levels are high..  Idiopathic small fiber peripheral neuropathy Established in the past with neurology.  Has not been seen in some time. 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  Neuropathy affects his knees to feet. Briefly discussed the use of Lyrica and Cymbalta establishment and he will think about if he would like to start medication.   Prostate cancer screening Completed in outside lab- abstracted (3.4)  Colon cancer screening Due 08/2022- dr. Benson Norway Need for vaccination for pneumococcus declined Routine general medical examination at a health care facility Colonoscopy: last screen 08/2015, recommend follow up 7 yr;  Completed by Dr.Hung >due this yr- encouraged pt to  schedule with Dr. Benson Norway.  Immunizations:  tdap UTD 2016, influenza declined, PNA series declined, Shingrix completed series Infectious disease screening: HIV completed, Hep C completed.  PSA: No results found for: "PSA", pt was  counseled on prostate cancer screenings.  PSA 3.4 (01/26/2022) - Comprehensive metabolic panel - Hemoglobin A1c - TSH Patient was encouraged to exercise greater than 150 minutes a week. Patient was encouraged to choose a diet filled with fresh fruits and vegetables, and lean meats. AVS provided to patient today for education/recommendation on gender specific health and safety maintenance.  Return in about 24 weeks (around 08/02/2022) for Routine chronic condition follow-up.   Orders Placed This Encounter  Procedures   Comprehensive metabolic panel   Hemoglobin A1c   TSH   Meds ordered this encounter  Medications   metFORMIN (GLUCOPHAGE-XR) 500 MG 24 hr tablet    Sig: Take 2 tablets (1,000 mg total) by mouth daily with supper.    Dispense:  180 tablet    Refill:  1   lisinopril-hydrochlorothiazide (ZESTORETIC) 20-12.5 MG tablet    Sig: Take 2 tablets by mouth daily.    Dispense:  180 tablet    Refill:  1   amLODipine (NORVASC) 2.5 MG tablet    Sig: Take 1 tablet (2.5 mg total) by mouth daily.    Dispense:  90 tablet    Refill:  1   Referral Orders  No referral(s) requested today     Note is dictated utilizing voice recognition software. Although note has been proof read prior to signing, occasional typographical errors still can be missed. If any questions arise, please do not hesitate to call for verification.  Electronically signed by: Howard Pouch, DO [place]

## 2022-02-15 NOTE — Patient Instructions (Addendum)
Return in about 24 weeks (around 08/02/2022) for Routine chronic condition follow-up.  Your colonoscopy will be due this summer!      Great to see you today.  I have refilled the medication(s) we provide.   If labs were collected, we will inform you of lab results once received either by echart message or telephone call.   - echart message- for normal results that have been seen by the patient already.   - telephone call: abnormal results or if patient has not viewed results in their echart.

## 2022-02-16 ENCOUNTER — Telehealth: Payer: Self-pay | Admitting: Family Medicine

## 2022-02-16 LAB — COMPREHENSIVE METABOLIC PANEL
ALT: 27 IU/L (ref 0–44)
AST: 19 IU/L (ref 0–40)
Albumin/Globulin Ratio: 1.9 (ref 1.2–2.2)
Albumin: 4.5 g/dL (ref 3.9–4.9)
Alkaline Phosphatase: 50 IU/L (ref 44–121)
BUN/Creatinine Ratio: 14 (ref 10–24)
BUN: 19 mg/dL (ref 8–27)
Bilirubin Total: 0.4 mg/dL (ref 0.0–1.2)
CO2: 19 mmol/L — ABNORMAL LOW (ref 20–29)
Calcium: 9.6 mg/dL (ref 8.6–10.2)
Chloride: 98 mmol/L (ref 96–106)
Creatinine, Ser: 1.38 mg/dL — ABNORMAL HIGH (ref 0.76–1.27)
Globulin, Total: 2.4 g/dL (ref 1.5–4.5)
Glucose: 207 mg/dL — ABNORMAL HIGH (ref 70–99)
Potassium: 4.2 mmol/L (ref 3.5–5.2)
Sodium: 140 mmol/L (ref 134–144)
Total Protein: 6.9 g/dL (ref 6.0–8.5)
eGFR: 57 mL/min/{1.73_m2} — ABNORMAL LOW (ref >59)

## 2022-02-16 LAB — TSH: TSH: 3.13 u[IU]/mL (ref 0.450–4.500)

## 2022-02-16 LAB — HEMOGLOBIN A1C
Est. average glucose Bld gHb Est-mCnc: 143 mg/dL
Hgb A1c MFr Bld: 6.6 % — ABNORMAL HIGH (ref 4.8–5.6)

## 2022-02-16 NOTE — Telephone Encounter (Signed)
Please call patient Liver and thyroid functions are normal Kidney function is stable Blood cell counts and electrolytes are normal A1c went up a small fraction to 6.6.  This is still really well-controlled diabetes.  Continue metformin at current doses.   Also please remind him his colonoscopy will be due this August.

## 2022-02-16 NOTE — Telephone Encounter (Signed)
LM for pt to return call to discuss.  

## 2022-02-18 NOTE — Telephone Encounter (Signed)
Letter sent.

## 2022-02-18 NOTE — Telephone Encounter (Signed)
LM for pt to return call to discuss.  

## 2022-03-22 DIAGNOSIS — E7211 Homocystinuria: Secondary | ICD-10-CM | POA: Diagnosis not present

## 2022-03-22 DIAGNOSIS — Z13220 Encounter for screening for lipoid disorders: Secondary | ICD-10-CM | POA: Diagnosis not present

## 2022-03-22 DIAGNOSIS — E559 Vitamin D deficiency, unspecified: Secondary | ICD-10-CM | POA: Diagnosis not present

## 2022-03-22 DIAGNOSIS — R739 Hyperglycemia, unspecified: Secondary | ICD-10-CM | POA: Diagnosis not present

## 2022-03-23 DIAGNOSIS — H524 Presbyopia: Secondary | ICD-10-CM | POA: Diagnosis not present

## 2022-04-07 ENCOUNTER — Other Ambulatory Visit (HOSPITAL_COMMUNITY): Payer: Self-pay

## 2022-04-08 ENCOUNTER — Other Ambulatory Visit: Payer: Self-pay

## 2022-04-08 ENCOUNTER — Other Ambulatory Visit (HOSPITAL_COMMUNITY): Payer: Self-pay

## 2022-05-06 ENCOUNTER — Other Ambulatory Visit (HOSPITAL_COMMUNITY): Payer: Self-pay

## 2022-07-15 LAB — BASIC METABOLIC PANEL
BUN: 15 (ref 4–21)
CO2: 22 (ref 13–22)
Chloride: 98 — AB (ref 99–108)
Creatinine: 1.3 (ref 0.6–1.3)
Glucose: 131
Potassium: 4.7 mEq/L (ref 3.5–5.1)
Sodium: 140 (ref 137–147)

## 2022-07-15 LAB — CBC AND DIFFERENTIAL
HCT: 62 — AB (ref 41–53)
Hemoglobin: 20 — AB (ref 13.5–17.5)
Neutrophils Absolute: 4.6
Platelets: 171 10*3/uL (ref 150–400)
WBC: 6.7

## 2022-07-15 LAB — LIPID PANEL
Cholesterol: 182 (ref 0–200)
HDL: 38 (ref 35–70)
LDL Cholesterol: 120
Triglycerides: 132 (ref 40–160)

## 2022-07-15 LAB — HEPATIC FUNCTION PANEL
ALT: 99 U/L — AB (ref 10–40)
AST: 22 (ref 14–40)
Alkaline Phosphatase: 48 (ref 25–125)
Alkaline Phosphatase: 48 (ref 25–125)
Bilirubin, Total: 0.5

## 2022-07-15 LAB — COMPREHENSIVE METABOLIC PANEL
Albumin: 4.7 (ref 3.5–5.0)
Calcium: 10.9 — AB (ref 8.7–10.7)
Globulin: 2.2
eGFR: 64

## 2022-07-15 LAB — CBC: RBC: 6.93 — AB (ref 3.87–5.11)

## 2022-07-15 LAB — IRON,TIBC AND FERRITIN PANEL
Ferritin: 69
Iron: 114

## 2022-07-15 LAB — PSA: PSA: 1.2

## 2022-07-15 LAB — HEMOGLOBIN A1C: Hemoglobin A1C: 6.9

## 2022-07-15 LAB — TESTOSTERONE: Testosterone: 995

## 2022-07-23 ENCOUNTER — Other Ambulatory Visit (HOSPITAL_COMMUNITY): Payer: Self-pay

## 2022-07-23 MED ORDER — METFORMIN HCL ER 750 MG PO TB24
750.0000 mg | ORAL_TABLET | Freq: Every day | ORAL | 11 refills | Status: DC
  Filled 2022-07-23: qty 60, 30d supply, fill #0

## 2022-07-25 ENCOUNTER — Encounter: Payer: Self-pay | Admitting: Family Medicine

## 2022-07-26 MED ORDER — AMLODIPINE BESYLATE 2.5 MG PO TABS: 2.5000 mg | ORAL_TABLET | Freq: Every day | ORAL | 0 refills | Status: DC

## 2022-07-26 MED ORDER — LISINOPRIL-HYDROCHLOROTHIAZIDE 20-12.5 MG PO TABS: 2.0000 | ORAL_TABLET | Freq: Every day | ORAL | 0 refills | Status: DC

## 2022-07-26 MED ORDER — METFORMIN HCL ER 500 MG PO TB24: 1000.0000 mg | ORAL_TABLET | Freq: Every day | ORAL | 0 refills | Status: DC

## 2022-08-02 ENCOUNTER — Other Ambulatory Visit (HOSPITAL_COMMUNITY): Payer: Self-pay

## 2022-08-05 ENCOUNTER — Ambulatory Visit (INDEPENDENT_AMBULATORY_CARE_PROVIDER_SITE_OTHER): Payer: 59 | Admitting: Family Medicine

## 2022-08-05 ENCOUNTER — Encounter: Payer: Self-pay | Admitting: Family Medicine

## 2022-08-05 VITALS — BP 127/74 | HR 76 | Temp 97.9°F | Wt 251.8 lb

## 2022-08-05 DIAGNOSIS — Z8249 Family history of ischemic heart disease and other diseases of the circulatory system: Secondary | ICD-10-CM

## 2022-08-05 DIAGNOSIS — I77810 Thoracic aortic ectasia: Secondary | ICD-10-CM

## 2022-08-05 DIAGNOSIS — E1169 Type 2 diabetes mellitus with other specified complication: Secondary | ICD-10-CM | POA: Diagnosis not present

## 2022-08-05 DIAGNOSIS — Z6830 Body mass index (BMI) 30.0-30.9, adult: Secondary | ICD-10-CM

## 2022-08-05 DIAGNOSIS — Z79890 Hormone replacement therapy (postmenopausal): Secondary | ICD-10-CM | POA: Diagnosis not present

## 2022-08-05 DIAGNOSIS — I10 Essential (primary) hypertension: Secondary | ICD-10-CM

## 2022-08-05 DIAGNOSIS — Z7984 Long term (current) use of oral hypoglycemic drugs: Secondary | ICD-10-CM

## 2022-08-05 DIAGNOSIS — Z5320 Procedure and treatment not carried out because of patient's decision for unspecified reasons: Secondary | ICD-10-CM

## 2022-08-05 DIAGNOSIS — E785 Hyperlipidemia, unspecified: Secondary | ICD-10-CM | POA: Diagnosis not present

## 2022-08-05 DIAGNOSIS — D582 Other hemoglobinopathies: Secondary | ICD-10-CM

## 2022-08-05 DIAGNOSIS — G609 Hereditary and idiopathic neuropathy, unspecified: Secondary | ICD-10-CM

## 2022-08-05 DIAGNOSIS — I5189 Other ill-defined heart diseases: Secondary | ICD-10-CM

## 2022-08-05 MED ORDER — AMLODIPINE BESYLATE 2.5 MG PO TABS: 2.5000 mg | ORAL_TABLET | Freq: Every day | ORAL | 1 refills | Status: DC

## 2022-08-05 MED ORDER — METFORMIN HCL ER 500 MG PO TB24: 1000.0000 mg | ORAL_TABLET | Freq: Every day | ORAL | 0 refills | Status: DC

## 2022-08-05 MED ORDER — METFORMIN HCL ER 500 MG PO TB24: 1000.0000 mg | ORAL_TABLET | Freq: Every day | ORAL | 1 refills | Status: DC

## 2022-08-05 MED ORDER — EZETIMIBE 10 MG PO TABS: 10.0000 mg | ORAL_TABLET | Freq: Every day | ORAL | 3 refills | Status: DC

## 2022-08-05 MED ORDER — AMLODIPINE BESYLATE 2.5 MG PO TABS: 2.5000 mg | ORAL_TABLET | Freq: Every day | ORAL | 0 refills | Status: DC

## 2022-08-05 MED ORDER — LISINOPRIL-HYDROCHLOROTHIAZIDE 20-12.5 MG PO TABS: 2.0000 | ORAL_TABLET | Freq: Every day | ORAL | 0 refills | Status: DC

## 2022-08-05 MED ORDER — LISINOPRIL-HYDROCHLOROTHIAZIDE 20-12.5 MG PO TABS: 2.0000 | ORAL_TABLET | Freq: Every day | ORAL | 1 refills | Status: DC

## 2022-08-05 NOTE — Patient Instructions (Addendum)
Return in about 28 weeks (around 02/17/2023) for cpe (20 min), Routine chronic condition follow-up.        Great to see you today.  I have refilled the medication(s) we provide.   If labs were collected, we will inform you of lab results once received either by echart message or telephone call.   - echart message- for normal results that have been seen by the patient already.   - telephone call: abnormal results or if patient has not viewed results in their echart.

## 2022-08-05 NOTE — Progress Notes (Signed)
Patient ID: Stephen Dorsey, male  DOB: [birth year], 62 y.o.   MRN: [id number] Patient Care Team    Relationship Specialty Notifications Start End  Natalia Leatherwood, DO PCP - General Family Medicine  09/23/21   Jeani Hawking, MD Consulting Physician Gastroenterology  09/23/21   Donna Christen, OD  Optometry  09/23/21   Alan Mulder, DC  Chiropractic Medicine  09/23/21   Suanne Marker, MD Consulting Physician Neurology  09/23/21   Kizzie Ide, MD    09/23/21    Comment: Functional med  Malva Cogan, MD Referring Physician Dermatology  09/24/21     Chief Complaint  Patient presents with   Diabetes    Metformin was increase on 07/12    Subjective:  Stephen Dorsey is a 62 y.o. male present forChronic Conditions/illness Management  All past medical history, surgical history, allergies, family history, immunizations, medications and social history were updated in the electronic medical record today. All recent labs, ED visits and hospitalizations within the last year were reviewed.  Diabetes/HLD/obesity Pt reports compliance with metformin 500 XR BID.  Patient denies dizziness, hyperglycemic or hypoglycemic events. Patient denies numbness, tingling in the extremities or nonhealing wounds of feet.  The has a history of idiopathic small fiber neuropathy of his lower extremities.    Low testosterone/warm replacement therapy/elevated hemoglobin-secondary polycythemia Managed by functional med-pellet hormone therapy.  He reports he has phlebotomies when his hemoglobin becomes elevated.  Idiopathic small fiber peripheral neuropathy Has established with neurology in 2019. 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  Neuropathy affects his knees to feet.  Hypertension/HLD/h/o abnormal stress test (2015)/obesity/diastolic dysfunction-grade 1/aortic root dilatation-mild Pt reports compliance with lisinopril- HCTZ 40-25.   Patient denies  chest pain, shortness of breath, dizziness or lower extremity edema.  Pt does not take a daily baby ASA. Pt is not prescribed statin due to choice.  After cardiac CT completed he was agreeable to start Zetia. RF: HTN, obesity, DM, FH heart disease, abnormal stress test/cardiac CT score 52.8/50th percentile  STUDIES: Cardiac CT 2023: FINDINGS:  Coronary Calcium Score:  Left main: 0  Left anterior descending artery: 33.6  Left circumflex artery: 19.2  Right coronary artery: 0  Total: 52.8  Percentile: 50th   Myocardial perfusion image 4/8//2015: Exercise Capacity:  Good exercise capacity. BP Response:  Hypotensive blood pressure response. Clinical Symptoms:  There is dyspnea. ECG Impression:  There are scattered PVCs. Comparison with Prior Nuclear Study: No previous nuclear study performed Overall Impression:  Intermediate risk stress nuclear study with moderate-sized area of partially reversible inferoseptal ischemia (SDS 5, Extent 7%) There was a hypotensive response to exercise. PVC's were noted, but no diagnostic ST segment depression was appreciated.] LV Wall Motion:  EF 63%, basal inferoseptal hypokinesis.  Echocardiogram 03/20/2013: - Left ventricle: The cavity size was mildly dilated. Wall    thickness was normal. Systolic function was normal. The    estimated ejection fraction was in the range of 55% to    60%. Wall motion was normal; there were no regional wall    motion abnormalities. Doppler parameters are consistent    with abnormal left ventricular relaxation (grade 1    diastolic dysfunction).  - Aortic valve: Trivial regurgitation.  - Aortic root: The aortic root was mildly dilated.  - Left atrium: The atrium was mildly dilated.   Lower extremity arterial Doppler 02/22/2013: Normal bilaterally      08/05/2022    8:45  AM 09/23/2021    8:42 AM  Depression screen PHQ 2/9  Decreased Interest 0 0  Down, Depressed, Hopeless 0 0  PHQ - 2 Score 0 0       No data to  display                08/05/2022    8:45 AM 03/31/2016    2:27 PM  Fall Risk   Falls in the past year? 0 No  Number falls in past yr: 0   Injury with Fall? 0   Follow up Falls evaluation completed     Immunization History  Administered Date(s) Administered   PFIZER(Purple Top)SARS-COV-2 Vaccination 05/10/2019, 06/04/2019   Tdap 07/03/2014   Zoster Recombinant(Shingrix) 01/08/2020, 03/14/2020   Past Medical History:  Diagnosis Date   Cataract 01/11/2021   Very slight imparement   Colon polyps    By colonoscopy in 2009    Decreased pedal pulses    Diabetes (HCC)    GERD (gastroesophageal reflux disease) 01/11/2021   Occasionally   HLD (hyperlipidemia)    Hypertension    Idiopathic small fiber sensory neuropathy 2008   Dx in 2008. Getting worse, has trouble feeling his feet. No Sx's in arms.   Kidney stone 1998   Low testosterone    Pneumonia due to COVID-19 virus 02/17/2019   No Known Allergies Past Surgical History:  Procedure Laterality Date   KIDNEY STONE SURGERY     Family History  Problem Relation Age of Onset   Thyroid disease Mother        thyroid problems   Heart attack Mother    Hypertension Father    Hyperlipidemia Father    Lung cancer Father    Thyroid disease Sister    Arthritis Sister    Heart attack Maternal Grandmother    Gaucher's disease Maternal Grandmother    Heart attack Maternal Grandfather    Diabetes Maternal Grandfather    Hearing loss Maternal Grandfather    Arthritis Paternal Grandmother    Hearing loss Paternal Grandmother    Heart attack Paternal Grandmother    Heart attack Paternal Grandfather    Social History   Social History Narrative   Marital status/children/pets: Married.   Education/employment: Works for [employer].  Education 4 yr college.  BSEE.   Safety:      -smoke alarm in the home:Yes     - wears seatbelt: Yes     - Feels safe in their relationships: Yes       Allergies as of 08/05/2022   No Known  Allergies      Medication List        Accurate as of August 05, 2022  8:58 AM. If you have any questions, ask your nurse or doctor.          STOP taking these medications    folic acid 800 MCG tablet Commonly known as: FOLVITE Stopped by: Felix Pacini   Magnesium 500 MG Caps Stopped by: Felix Pacini   Potassium 99 MG Tabs Stopped by: Felix Pacini   Vitamin B-12 5000 MCG Tbdp Stopped by: Felix Pacini   VITAMIN C PO Stopped by: Felix Pacini   VITAMIN D (CHOLECALCIFEROL) PO Stopped by: Felix Pacini       TAKE these medications    amLODipine 2.5 MG tablet Commonly known as: NORVASC Take 1 tablet (2.5 mg total) by mouth daily.   ezetimibe 10 MG tablet Commonly known as: Zetia Take 1 tablet (10 mg total) by mouth at  bedtime. Started by: Felix Pacini   lisinopril-hydrochlorothiazide 20-12.5 MG tablet Commonly known as: ZESTORETIC Take 2 tablets by mouth daily.   metFORMIN 500 MG 24 hr tablet Commonly known as: GLUCOPHAGE-XR Take 2 tablets (1,000 mg total) by mouth daily with supper. What changed: Another medication with the same name was removed. Continue taking this medication, and follow the directions you see here. Changed by: Felix Pacini   tadalafil 10 MG tablet Commonly known as: Cialis Take 1 - 2 tablets as needed prior to sexual activity       All past medical history, surgical history, allergies, family history, immunizations andmedications were updated in the EMR today and reviewed under the history and medication portions of their EMR.     Recent Results (from the past 2160 hour(s))  Basic metabolic panel     Status: Abnormal   Collection Time: 07/15/22 12:00 AM  Result Value Ref Range   Glucose 131    BUN 15 4 - 21   CO2 22 13 - 22   Creatinine 1.3 0.6 - 1.3   Potassium 4.7 3.5 - 5.1 mEq/L   Sodium 140 137 - 147   Chloride 98 (A) 99 - 108  Comprehensive metabolic panel     Status: Abnormal   Collection Time: 07/15/22 12:00 AM  Result  Value Ref Range   Globulin 2.2    eGFR 64    Calcium 10.9 (A) 8.7 - 10.7   Albumin 4.7 3.5 - 5.0  Hepatic function panel     Status: None   Collection Time: 07/15/22 12:00 AM  Result Value Ref Range   Alkaline Phosphatase 48 25 - 125  Hepatic function panel     Status: Abnormal   Collection Time: 07/15/22 12:00 AM  Result Value Ref Range   Alkaline Phosphatase 48 25 - 125   ALT 99 (A) 10 - 40 U/L   AST 22 14 - 40   Bilirubin, Total 0.5     CT CARDIAC SCORING (SELF PAY ONLY) Addendum Date: 10/29/2021   FINDINGS:  Coronary Calcium Score:  Left main: 0  Left anterior descending artery: 33.6  Left circumflex artery: 19.2  Right coronary artery: 0  Total: 52.8  Percentile: 50th  Pericardium: Normal. Aortic valve calcium score 2, trace calcifications. Ascending Aorta: Upper limit of normal caliber. Ascending aorta measures approximately 38mm at the mid ascending aorta measured in a non-contrast axial plane. Non-cardiac: See separate report from [facility].  IMPRESSION: Coronary calcium score of 52.8. This was 50th percentile for age-, race-, and sex-matched controls.   Result Date: 10/29/2021 IMPRESSION: 1. No acute findings in the imaged extracardiac chest. 2. Hepatic steatosis.   ROS 14 pt review of systems performed and negative (unless mentioned in an HPI)  Objective: BP 127/74   Pulse 76   Temp 97.9 F (36.6 C)   Wt 251 lb 12.8 oz (114.2 kg)   SpO2 96%   BMI 30.98 kg/m  Physical Exam Vitals and nursing note reviewed.  Constitutional:      General: He is not in acute distress.    Appearance: Normal appearance. He is not ill-appearing, toxic-appearing or diaphoretic.  HENT:     Head: Normocephalic and atraumatic.  Eyes:     General: No scleral icterus.       Right eye: No discharge.        Left eye: No discharge.     Extraocular Movements: Extraocular movements intact.     Pupils: Pupils are equal, round, and  reactive to light.  Cardiovascular:      Rate and Rhythm: Normal rate and regular rhythm.  Pulmonary:     Effort: Pulmonary effort is normal. No respiratory distress.     Breath sounds: Normal breath sounds. No wheezing, rhonchi or rales.  Musculoskeletal:     Right lower leg: No edema.     Left lower leg: No edema.  Skin:    General: Skin is warm.     Findings: No rash.  Neurological:     Mental Status: He is alert and oriented to person, place, and time. Mental status is at baseline.  Psychiatric:        Mood and Affect: Mood normal.        Behavior: Behavior normal.        Thought Content: Thought content normal.        Judgment: Judgment normal.     No results found.  Assessment/plan: LASHAUN KRAPF is a 62 y.o. male present for Chronic Conditions/illness Management Type 2 diabetes mellitus with hyperlipidemia (HCC) Continue metformin 500 XR twice daily Diabetic diet Routine exercise - Microalbumin: Completed 08/05/2022 -Encourage influenza vaccine yearly-declined today Pneumonia declined -Foot exam completed completed 08/05/2022 -Eye exam completed 09/10/2021- completed-requested records x2 -A1c 6.3 >7.0>6.3>6.6>6.9 (07/15/2022)  Primary hypertension/history of heart disease/abnormal stress test (2015)/obesity/grade 1 diastolic dysfunction/aortic root dilatation mild/STATIN declined We discussed the benefits of statin in detail.  He is hesitant to start a statin because his father had myalgias with statin.  He has a significant family history of heart disease with 5 members of his family dying of heart attack.  Cardiac CT returned 50th percentile and he was agreeable to start Zetia but not a statin. Continue amlodipine 2.5 mg daily. Continue lisinopril-HCTZ 40-25 mg daily. Not taking - never started Zetia, declined statin- again discussed CV risk- he is not certain he wants to take any med for CV risk. LDL not at goal <70. LDL 120 outside labs 07/13/2022. He states he is willing to start zetia now.  - Had seen Dr.  Alexander Mt many years ago, has not been seen since 2015. - CT CARDIAC SCORING: 50th percentile  Low testosterone/hormone replacement/elevated hemoglobin Managed by functional med-pellet hormone He has elevated hemoglobin and hematocrit on labs he brings in with him from [facility].  He is aware of this and has occasional therapeutic phlebotomy when levels are high.. Psa normal 07/2022 Tsh wnl 07/2022 Hgb was 20!!! At outside labs again was told he needs to give blood stat and follow up with the provider managing testosterone about dose. Discussed risks associated with hgb this high. He was encouraged to donation blood asap and f/u with specialist thereafter for recheck and discussion non his testosterone dose.   Idiopathic small fiber peripheral neuropathy Established in the past with neurology.  Has not been seen in some time. 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  Neuropathy affects his knees to feet. Briefly discussed the use of Lyrica and Cymbalta in the past with him  and he will think about if he would like to start medication.   Return in about 28 weeks (around 02/17/2023) for cpe (20 min), Routine chronic condition follow-up.   Orders Placed This Encounter  Procedures   Urine Microalbumin w/creat. ratio   Meds ordered this encounter  Medications   DISCONTD: amLODipine (NORVASC) 2.5 MG tablet    Sig: Take 1 tablet (2.5 mg total) by mouth daily.    Dispense:  30  tablet    Refill:  0   DISCONTD: lisinopril-hydrochlorothiazide (ZESTORETIC) 20-12.5 MG tablet    Sig: Take 2 tablets by mouth daily.    Dispense:  60 tablet    Refill:  0   DISCONTD: metFORMIN (GLUCOPHAGE-XR) 500 MG 24 hr tablet    Sig: Take 2 tablets (1,000 mg total) by mouth daily with supper.    Dispense:  60 tablet    Refill:  0   DISCONTD: metFORMIN (GLUCOPHAGE-XR) 500 MG 24 hr tablet    Sig: Take 2 tablets (1,000 mg total) by mouth daily with supper.     Dispense:  180 tablet    Refill:  1   lisinopril-hydrochlorothiazide (ZESTORETIC) 20-12.5 MG tablet    Sig: Take 2 tablets by mouth daily.    Dispense:  180 tablet    Refill:  1   amLODipine (NORVASC) 2.5 MG tablet    Sig: Take 1 tablet (2.5 mg total) by mouth daily.    Dispense:  90 tablet    Refill:  1   ezetimibe (ZETIA) 10 MG tablet    Sig: Take 1 tablet (10 mg total) by mouth at bedtime.    Dispense:  90 tablet    Refill:  3   metFORMIN (GLUCOPHAGE-XR) 500 MG 24 hr tablet    Sig: Take 2 tablets (1,000 mg total) by mouth daily with supper.    Dispense:  180 tablet    Refill:  1   Referral Orders  No referral(s) requested today     Note is dictated utilizing voice recognition software. Although note has been proof read prior to signing, occasional typographical errors still can be missed. If any questions arise, please do not hesitate to call for verification.  Electronically signed by: Felix Pacini, DO [facility] [place]

## 2022-08-06 NOTE — Telephone Encounter (Signed)
Called pt to discuss. Pt is going to call [employer] to try and find out exactly what the forms he is need of are called. I told pt to call back Monday so we can discuss further and if he would need an appointment for referral.

## 2022-08-06 NOTE — Telephone Encounter (Signed)
We received the form from the location he mentions? If not then I will refer him to our hematology department which will provide a therapeutic phlebotomy, and they will follow him to ensure his levels returned to normal.

## 2022-08-09 ENCOUNTER — Telehealth: Payer: Self-pay | Admitting: Family Medicine

## 2022-08-09 DIAGNOSIS — D582 Other hemoglobinopathies: Secondary | ICD-10-CM

## 2022-08-09 NOTE — Telephone Encounter (Signed)
Please inform patient I received a phone call from his gastroenterologist who is concerned about his hemoglobin levels as well. We have placed a referral to hematology urgently to get established and have therapeutic phlebotomy.  They will also follow him very closely to ensure his levels return to normal.  Do not feel the place he wanted [place] to fill out a form to have at therapeutic phlebotomy is a good choice.  I believe he needs hematology to follow him more closely.

## 2022-08-10 ENCOUNTER — Telehealth: Payer: Self-pay

## 2022-08-10 NOTE — Telephone Encounter (Signed)
Pt stopped by and dropped off form from [employer] that needs to be completed. Pt would like form faxed to [facility] if possible and emailed to him when completed. Form placed upfront.

## 2022-08-11 NOTE — Telephone Encounter (Signed)
I completed the [employer] form.   Please advise patient I had recommended he be seen by the hematologist. He needs to be evaluated and followed for his elevated Hgb on a routine basis. It is wonderful he wants to donate the blood, but right now he needs monitored to ensure he gets the levels down to a safe level.  His hgb is dangerously high and needs to follow with a hematologist, if he continues to receive the testosterone therapy.

## 2022-08-11 NOTE — Telephone Encounter (Signed)
See my chart message

## 2022-08-11 NOTE — Telephone Encounter (Signed)
Placed on PCP desk for signature. 

## 2022-08-13 NOTE — Telephone Encounter (Signed)
Spoke with patient regarding results/recommendations.  

## 2022-08-16 ENCOUNTER — Encounter: Payer: Self-pay | Admitting: Medical Oncology

## 2022-08-16 ENCOUNTER — Other Ambulatory Visit: Payer: Self-pay

## 2022-08-16 ENCOUNTER — Inpatient Hospital Stay: Payer: 59

## 2022-08-16 ENCOUNTER — Inpatient Hospital Stay: Payer: 59 | Attending: Medical Oncology | Admitting: Medical Oncology

## 2022-08-16 VITALS — BP 132/79 | HR 74 | Temp 98.1°F | Resp 18 | Ht 75.0 in | Wt 251.0 lb

## 2022-08-16 DIAGNOSIS — Z79890 Hormone replacement therapy (postmenopausal): Secondary | ICD-10-CM | POA: Diagnosis present

## 2022-08-16 DIAGNOSIS — D751 Secondary polycythemia: Secondary | ICD-10-CM | POA: Diagnosis present

## 2022-08-16 NOTE — Progress Notes (Signed)
Hematology and Oncology Follow Up Visit  Stephen Dorsey [id number] Dec 21, 1960 62 y.o. 08/16/2022  Past Medical History:  Diagnosis Date   Cataract 01/11/2021   Very slight imparement   Colon polyps    By colonoscopy in 2009    Decreased pedal pulses    Diabetes (HCC)    GERD (gastroesophageal reflux disease) 01/11/2021   Occasionally   HLD (hyperlipidemia)    Hypertension    Idiopathic small fiber sensory neuropathy 2008   Dx in 2008. Getting worse, has trouble feeling his feet. No Sx's in arms.   Kidney stone 1998   Low testosterone    Pneumonia due to COVID-19 virus 02/17/2019    Principle Diagnosis:  Secondary Polycythemia- testosterone supplementation and possible sleep apnea  Current Therapy:   81 mg asa Therapeutic phlebotomy     Interim History:  Stephen Dorsey is back for follow-up for his secondary polycythemia.   Today he reports that he has been doing well and feeling well.   His last blood donation/phlebotomy was Aug 2023 per patient.   He has continued his testosterone supplementation- he thinks his dose may have been increased recently but is not sure.   He denies SOB, chest pain, headaches, skin rash, abdominal pain     Wt Readings from Last 3 Encounters:  08/16/22 251 lb (113.9 kg)  08/05/22 251 lb 12.8 oz (114.2 kg)  02/15/22 254 lb 3.2 oz (115.3 kg)     Medications:   Current Outpatient Medications:    amLODipine (NORVASC) 2.5 MG tablet, Take 1 tablet (2.5 mg total) by mouth daily., Disp: 90 tablet, Rfl: 1   ezetimibe (ZETIA) 10 MG tablet, Take 1 tablet (10 mg total) by mouth at bedtime., Disp: 90 tablet, Rfl: 3   lisinopril-hydrochlorothiazide (ZESTORETIC) 20-12.5 MG tablet, Take 2 tablets by mouth daily., Disp: 180 tablet, Rfl: 1   metFORMIN (GLUCOPHAGE-XR) 500 MG 24 hr tablet, Take 2 tablets (1,000 mg total) by mouth daily with supper., Disp: 180 tablet, Rfl: 1   tadalafil (CIALIS) 10 MG tablet, Take 1 - 2 tablets as needed prior to sexual activity,  Disp: 30 tablet, Rfl: 11  Allergies: No Known Allergies  Past Medical History, Surgical history, Social history, and Family History were reviewed and updated.  Review of Systems: Review of Systems  Constitutional:  Negative for appetite change.  Respiratory:  Negative for shortness of breath.   Cardiovascular:  Negative for chest pain, leg swelling and palpitations.  Gastrointestinal:  Negative for abdominal pain.  Musculoskeletal:  Negative for back pain, flank pain, myalgias and neck pain.  Neurological:  Negative for headaches.     Physical Exam:  height is 6\' 3"  (1.905 m) and weight is 251 lb (113.9 kg). His oral temperature is 98.1 F (36.7 C). His blood pressure is 132/79 and his pulse is 74. His respiration is 18 and oxygen saturation is 98%.   Physical Exam General: NAD Cardiovascular: regular rate and rhythm Pulmonary: clear ant fields Abdomen: soft, nontender, + bowel sounds GU: no suprapubic tenderness Extremities: no edema, no joint deformities Skin: no rashes Neurological: Weakness but otherwise nonfocal   Lab Results  Component Value Date   WBC 6.7 07/15/2022   HGB 20.0 (A) 07/15/2022   HCT 62 (A) 07/15/2022   MCV 88.7 02/19/2019   PLT 171 07/15/2022     Chemistry      Component Value Date/Time   NA 140 07/15/2022 0000   K 4.7 07/15/2022 0000   CL 98 (A) 07/15/2022 0000  CO2 22 07/15/2022 0000   BUN 15 07/15/2022 0000   CREATININE 1.3 07/15/2022 0000   CREATININE 1.38 (H) 02/15/2022 0926   GLU 131 07/15/2022 0000      Component Value Date/Time   CALCIUM 10.9 (A) 07/13/2022 0000   ALKPHOS 48 07/15/2022 0000   ALKPHOS 48 07/15/2022 0000   AST 22 07/15/2022 0000   ALT 99 (A) 07/15/2022 0000   BILITOT 0.4 02/15/2022 0926      Assessment and Plan- Patient is a 62 y.o. male    Encounter Diagnosis  Name Primary?   Polycythemia, secondary Yes   Stephen Dorsey is a 62 y.o. male with polycythemia secondary to supplemental testosterone use.    He is doing well. He has gone almost a year without a phlebotomy which is likely why his labs are so elevated above his normal. He brings in [facility] labs. His Hgb is now 20 and HCT is 61.8. WBC 6.7, platelets 171. He would benefit from a donation/phlebotomy, continuation of his asa, and hydration with water. If not significantly improved at follow up I would recommend dose reduction of his testosterone pellets. We also discussed his snoring- he will continue weight reduction efforts to help improve any potential sleep apnea-does not want work for up this at this time.   Disposition: Phlebotomy today RTC 6 weeks APP, labs (CBC w/, CMP, iron, ferritin)+- Phlebotomy    Clent Jacks PA-C 8/5/20242:03 PM

## 2022-08-16 NOTE — Patient Instructions (Signed)

## 2022-08-16 NOTE — Progress Notes (Signed)
Stephen Dorsey presents today for phlebotomy per MD orders. Phlebotomy procedure started at 1048 and ended at 1055. 500 grams removed. Patient observed for 30 minutes after procedure without any incident. Patient tolerated procedure well. IV needle removed intact.

## 2022-08-24 ENCOUNTER — Other Ambulatory Visit: Payer: 59

## 2022-08-24 ENCOUNTER — Encounter: Payer: 59 | Admitting: Family

## 2022-10-05 LAB — HM COLONOSCOPY

## 2022-11-17 ENCOUNTER — Encounter: Payer: Self-pay | Admitting: Family Medicine

## 2022-11-21 ENCOUNTER — Other Ambulatory Visit: Payer: Self-pay | Admitting: Family Medicine

## 2022-11-22 MED ORDER — METFORMIN HCL ER 500 MG PO TB24: 1000.0000 mg | ORAL_TABLET | Freq: Every day | ORAL | 2 refills | Status: DC

## 2022-12-07 ENCOUNTER — Encounter: Payer: Self-pay | Admitting: Family Medicine

## 2022-12-07 NOTE — Progress Notes (Signed)
Care team updated and letter sent for eye exam notes.

## 2022-12-07 NOTE — Telephone Encounter (Signed)
Care team updated and letter sent for eye exam notes.

## 2023-01-13 ENCOUNTER — Other Ambulatory Visit: Payer: Self-pay | Admitting: Family Medicine

## 2023-02-10 ENCOUNTER — Other Ambulatory Visit: Payer: Self-pay | Admitting: Family Medicine

## 2023-02-10 ENCOUNTER — Other Ambulatory Visit: Payer: Self-pay

## 2023-02-10 MED ORDER — LISINOPRIL-HYDROCHLOROTHIAZIDE 20-12.5 MG PO TABS: 2.0000 | ORAL_TABLET | Freq: Every day | ORAL | 0 refills | Status: DC

## 2023-02-14 LAB — CBC AND DIFFERENTIAL
HCT: 50 (ref 41–53)
Hemoglobin: 17 (ref 13.5–17.5)
Neutrophils Absolute: 4.6
Platelets: 178 10*3/uL (ref 150–400)
WBC: 6.9

## 2023-02-14 LAB — BASIC METABOLIC PANEL
BUN: 20 (ref 4–21)
CO2: 21 (ref 13–22)
Chloride: 93 — AB (ref 99–108)
Creatinine: 1.2 (ref 0.6–1.3)
Glucose: 101
Potassium: 4.3 meq/L (ref 3.5–5.1)
Sodium: 132 — AB (ref 137–147)

## 2023-02-14 LAB — HEPATIC FUNCTION PANEL
ALT: 30 U/L (ref 10–40)
AST: 18 (ref 14–40)
Alkaline Phosphatase: 60 (ref 25–125)
Bilirubin, Total: 0.5

## 2023-02-14 LAB — HEMOGLOBIN A1C: Hemoglobin A1C: 7.1

## 2023-02-14 LAB — IRON,TIBC AND FERRITIN PANEL: Ferritin: 546

## 2023-02-14 LAB — CBC: RBC: 5.66 — AB (ref 3.87–5.11)

## 2023-02-14 LAB — LIPID PANEL
Cholesterol: 193 (ref 0–200)
HDL: 37 (ref 35–70)
LDL Cholesterol: 135
Triglycerides: 116 (ref 40–160)

## 2023-02-14 LAB — TESTOSTERONE: Testosterone: 203

## 2023-02-14 LAB — VITAMIN D 25 HYDROXY (VIT D DEFICIENCY, FRACTURES): Vit D, 25-Hydroxy: 37.5

## 2023-02-17 LAB — TSH
Free T4: 1.41 ng/dL
POC INR: 1
TSH: 3.5 (ref 0.41–5.90)

## 2023-02-17 LAB — COMPREHENSIVE METABOLIC PANEL
Albumin: 4.7 (ref 3.5–5.0)
Calcium: 8.9 (ref 8.7–10.7)
Globulin: 2.5
eGFR: 67

## 2023-02-17 LAB — PSA
PSA, FREE: 0.36
PSA: 1.2

## 2023-02-21 ENCOUNTER — Encounter: Payer: 59 | Admitting: Family Medicine

## 2023-02-21 ENCOUNTER — Encounter: Payer: Self-pay | Admitting: Family Medicine

## 2023-02-21 ENCOUNTER — Ambulatory Visit (INDEPENDENT_AMBULATORY_CARE_PROVIDER_SITE_OTHER): Payer: 59 | Admitting: Family Medicine

## 2023-02-21 VITALS — BP 123/74 | HR 62 | Ht 75.0 in | Wt 244.8 lb

## 2023-02-21 DIAGNOSIS — Z0000 Encounter for general adult medical examination without abnormal findings: Secondary | ICD-10-CM

## 2023-02-21 DIAGNOSIS — E1169 Type 2 diabetes mellitus with other specified complication: Secondary | ICD-10-CM | POA: Diagnosis not present

## 2023-02-21 DIAGNOSIS — Z23 Encounter for immunization: Secondary | ICD-10-CM

## 2023-02-21 DIAGNOSIS — E785 Hyperlipidemia, unspecified: Secondary | ICD-10-CM

## 2023-02-21 DIAGNOSIS — Z79890 Hormone replacement therapy (postmenopausal): Secondary | ICD-10-CM

## 2023-02-21 DIAGNOSIS — I5189 Other ill-defined heart diseases: Secondary | ICD-10-CM

## 2023-02-21 DIAGNOSIS — D582 Other hemoglobinopathies: Secondary | ICD-10-CM

## 2023-02-21 DIAGNOSIS — I77810 Thoracic aortic ectasia: Secondary | ICD-10-CM

## 2023-02-21 DIAGNOSIS — I10 Essential (primary) hypertension: Secondary | ICD-10-CM | POA: Diagnosis not present

## 2023-02-21 DIAGNOSIS — G609 Hereditary and idiopathic neuropathy, unspecified: Secondary | ICD-10-CM

## 2023-02-21 DIAGNOSIS — Z8249 Family history of ischemic heart disease and other diseases of the circulatory system: Secondary | ICD-10-CM

## 2023-02-21 DIAGNOSIS — Z7984 Long term (current) use of oral hypoglycemic drugs: Secondary | ICD-10-CM

## 2023-02-21 MED ORDER — METFORMIN HCL ER 500 MG PO TB24: 1000.0000 mg | ORAL_TABLET | Freq: Every day | ORAL | 5 refills | Status: DC

## 2023-02-21 MED ORDER — AMLODIPINE BESYLATE 2.5 MG PO TABS: 2.5000 mg | ORAL_TABLET | Freq: Every day | ORAL | 5 refills | Status: DC

## 2023-02-21 MED ORDER — LISINOPRIL-HYDROCHLOROTHIAZIDE 20-12.5 MG PO TABS: 2.0000 | ORAL_TABLET | Freq: Every day | ORAL | 5 refills | Status: DC

## 2023-02-21 MED ORDER — EZETIMIBE 10 MG PO TABS: 10.0000 mg | ORAL_TABLET | Freq: Every day | ORAL | 5 refills | Status: DC

## 2023-02-21 NOTE — Patient Instructions (Addendum)
 Return in about 24 weeks (around 08/08/2023) for Routine chronic condition follow-up.        Great to see you today.  I have refilled the medication(s) we provide.   If labs were collected or images ordered, we will inform you of  results once we have received them and reviewed. We will contact you either by echart message, or telephone call.  Please give ample time to the testing facility, and our office to run,  receive and review results. Please do not call inquiring of results, even if you can see them in your chart. We will contact you as soon as we are able. If it has been over 1 week since the test was completed, and you have not yet heard from us , then please call us .    - echart message- for normal results that have been seen by the patient already.   - telephone call: abnormal results or if patient has not viewed results in their echart.  If a referral to a specialist was entered for you, please call us  in 2 weeks if you have not heard from the specialist office to schedule.

## 2023-02-21 NOTE — Progress Notes (Signed)
 Patient ID: Stephen Dorsey, male  DOB: Sep 29, 1960, 63 y.o.   MRN: [id number] Patient Care Team    Relationship Specialty Notifications Start End  Mariel Shope, DO PCP - General Family Medicine  09/23/21   Alvis Jourdain, MD Consulting Physician Gastroenterology  09/23/21   Wyonia Hefty, OD  Optometry  09/23/21   Lucio Sabin, DC  Chiropractic Medicine  09/23/21   Omega Bible, MD Consulting Physician Neurology  09/23/21   Hobert Lull, MD    09/23/21    Comment: Functional med  Irwin Manual, MD Referring Physician Dermatology  09/24/21     Chief Complaint  Patient presents with   Annual Exam    Pt is not fasting    Subjective:  Stephen Dorsey is a 63 y.o. male present for for CPE and chronic Conditions/illness Management  All past medical history, surgical history, allergies, family history, immunizations, medications and social history were updated in the electronic medical record today. All recent labs, ED visits and hospitalizations within the last year were reviewed.  Health maintenance:  Colonoscopy: Completed, 09/2022, repeat 7 years.  Dr. Nickey Barn PSA: PSA reviewed collected 02/14/2023-on testosterone  therapy through integrative clinic Immunizations: Tdap UTD 2016, influenza-declined, Shingrix completed Infectious disease screening: HIV and hepatitis C screenings completed Lab Results  Component Value Date   PSA 1.2 02/14/2023   PSA 1.2 07/13/2022   PSA 3.4 01/18/2022     Diabetes/HLD/obesity Pt reports compliance with metformin  500 XR BID.  Patient denies dizziness, hyperglycemic or hypoglycemic events. Patient denies numbness, tingling in the extremities or nonhealing wounds of feet.  The has a history of idiopathic small fiber neuropathy of his lower extremities.    Low testosterone /warm replacement therapy/elevated hemoglobin-secondary polycythemia Managed by functional med-pellet hormone therapy.  He reports he has phlebotomies when his hemoglobin  becomes elevated. Has been significantly elevated 6 months ago in order to have phlebotomy  Idiopathic small fiber peripheral neuropathy Has established with neurology in 2019. 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  Neuropathy affects his knees to feet.  Hypertension/HLD/h/o abnormal stress test (2015)/obesity/diastolic dysfunction-grade 1/aortic root dilatation-mild Pt reports compliance with lisinopril - HCTZ 40-25 and amlodipine  2.5 mg daily.   Patient denies chest pain, shortness of breath, dizziness or lower extremity edema.  Pt does not take a daily baby ASA. Pt is not prescribed statin due to choice.  After cardiac CT completed he was agreeable to start Zetia .  Patient reports compliance with Zetia  RF: HTN, obesity, DM, FH heart disease, abnormal stress test/cardiac CT score 52.8/50th percentile  STUDIES: Cardiac CT 2023: FINDINGS:  Coronary Calcium  Score:  Left main: 0  Left anterior descending artery: 33.6  Left circumflex artery: 19.2  Right coronary artery: 0  Total: 52.8  Percentile: 50th   Myocardial perfusion image 4/8//2015: Exercise Capacity:  Good exercise capacity. BP Response:  Hypotensive blood pressure response. Clinical Symptoms:  There is dyspnea. ECG Impression:  There are scattered PVCs. Comparison with Prior Nuclear Study: No previous nuclear study performed Overall Impression:  Intermediate risk stress nuclear study with moderate-sized area of partially reversible inferoseptal ischemia (SDS 5, Extent 7%) There was a hypotensive response to exercise. PVC's were noted, but no diagnostic ST segment depression was appreciated.] LV Wall Motion:  EF 63%, basal inferoseptal hypokinesis.  Echocardiogram 03/20/2013: - Left ventricle: The cavity size was mildly dilated. Wall    thickness was normal. Systolic function was normal. The    estimated  ejection fraction was in the range of 55% to    60%. Wall motion was  normal; there were no regional wall    motion abnormalities. Doppler parameters are consistent    with abnormal left ventricular relaxation (grade 1    diastolic dysfunction).  - Aortic valve: Trivial regurgitation.  - Aortic root: The aortic root was mildly dilated.  - Left atrium: The atrium was mildly dilated.   Lower extremity arterial Doppler 02/22/2013: Normal bilaterally      02/21/2023    9:04 AM 08/05/2022    8:45 AM 09/23/2021    8:42 AM  Depression screen PHQ 2/9  Decreased Interest 0 0 0  Down, Depressed, Hopeless 0 0 0  PHQ - 2 Score 0 0 0      02/21/2023    9:04 AM  GAD 7 : Generalized Anxiety Score  Nervous, Anxious, on Edge 0  Control/stop worrying 0  Worry too much - different things 0  Trouble relaxing 0  Restless 0  Easily annoyed or irritable 0  Afraid - awful might happen 0  Total GAD 7 Score 0  Anxiety Difficulty Not difficult at all          08/05/2022    8:45 AM 03/31/2016    2:27 PM  Fall Risk   Falls in the past year? 0 No  Number falls in past yr: 0   Injury with Fall? 0   Follow up Falls evaluation completed     Immunization History  Administered Date(s) Administered   PFIZER(Purple Top)SARS-COV-2 Vaccination 05/10/2019, 06/04/2019   Tdap 07/03/2014   Zoster Recombinant(Shingrix) 01/08/2020, 03/14/2020   Past Medical History:  Diagnosis Date   Cataract 01/11/2021   Very slight imparement   Colon polyps    By colonoscopy in 2009    Decreased pedal pulses    Diabetes (HCC)    GERD (gastroesophageal reflux disease) 01/11/2021   Occasionally   HLD (hyperlipidemia)    Hypertension    Idiopathic small fiber sensory neuropathy 2008   Dx in 2008. Getting worse, has trouble feeling his feet. No Sx's in arms.   Kidney stone 1998   Low testosterone     Pneumonia due to COVID-19 virus 02/17/2019   No Known Allergies Past Surgical History:  Procedure Laterality Date   KIDNEY STONE SURGERY     Family History  Problem Relation Age of  Onset   Thyroid  disease Mother        thyroid  problems   Heart attack Mother    Hypertension Father    Hyperlipidemia Father    Lung cancer Father    Thyroid  disease Sister    Arthritis Sister    Heart attack Maternal Grandmother    Gaucher's disease Maternal Grandmother    Heart attack Maternal Grandfather    Diabetes Maternal Grandfather    Hearing loss Maternal Grandfather    Arthritis Paternal Grandmother    Hearing loss Paternal Grandmother    Heart attack Paternal Grandmother    Heart attack Paternal Grandfather    Social History   Social History Narrative   Marital status/children/pets: Married.   Education/employment: Works for [employer].  Education 4 yr college.  BSEE.   Safety:      -smoke alarm in the home:Yes     - wears seatbelt: Yes     - Feels safe in their relationships: Yes       Allergies as of 02/21/2023   No Known Allergies      Medication List  Accurate as of February 21, 2023  9:34 AM. If you have any questions, ask your nurse or doctor.          amLODipine  2.5 MG tablet Commonly known as: NORVASC  Take 1 tablet (2.5 mg total) by mouth daily.   ezetimibe  10 MG tablet Commonly known as: Zetia  Take 1 tablet (10 mg total) by mouth at bedtime.   fluorouracil 5 % cream Commonly known as: EFUDEX Apply topically 2 (two) times daily.   lisinopril -hydrochlorothiazide  20-12.5 MG tablet Commonly known as: ZESTORETIC  Take 2 tablets by mouth daily.   metFORMIN  500 MG 24 hr tablet Commonly known as: GLUCOPHAGE -XR Take 2 tablets (1,000 mg total) by mouth daily with supper.   tadalafil  10 MG tablet Commonly known as: Cialis  Take 1 - 2 tablets as needed prior to sexual activity       All past medical history, surgical history, allergies, family history, immunizations andmedications were updated in the EMR today and reviewed under the history and medication portions of their EMR.     Recent Results (from the past 2160 hours)   Iron, TIBC and Ferritin Panel     Status: None   Collection Time: 02/14/23 12:00 AM  Result Value Ref Range   Ferritin 546   CBC and differential     Status: None   Collection Time: 02/14/23 12:00 AM  Result Value Ref Range   Hemoglobin 17.0 13.5 - 17.5   HCT 50 41 - 53   Neutrophils Absolute 4.60    Platelets 178 150 - 400 K/uL   WBC 6.9   CBC     Status: Abnormal   Collection Time: 02/14/23 12:00 AM  Result Value Ref Range   RBC 5.66 (A) 3.87 - 5.11  VITAMIN D  25 Hydroxy (Vit-D Deficiency, Fractures)     Status: None   Collection Time: 02/14/23 12:00 AM  Result Value Ref Range   Vit D, 25-Hydroxy 37.5   Basic metabolic panel     Status: Abnormal   Collection Time: 02/14/23 12:00 AM  Result Value Ref Range   Glucose 101    BUN 20 4 - 21   CO2 21 13 - 22   Creatinine 1.2 0.6 - 1.3   Potassium 4.3 3.5 - 5.1 mEq/L   Sodium 132 (A) 137 - 147   Chloride 93 (A) 99 - 108  Comprehensive metabolic panel     Status: None   Collection Time: 02/14/23 12:00 AM  Result Value Ref Range   Globulin 2.5    eGFR 67    Calcium  8.9 8.7 - 10.7   Albumin 4.7 3.5 - 5.0  Lipid panel     Status: None   Collection Time: 02/14/23 12:00 AM  Result Value Ref Range   Triglycerides 116 40 - 160   Cholesterol 193 0 - 200   HDL 37 35 - 70   LDL Cholesterol 135   Hepatic function panel     Status: None   Collection Time: 02/14/23 12:00 AM  Result Value Ref Range   Alkaline Phosphatase 60 25 - 125   ALT 30 10 - 40 U/L   AST 18 14 - 40   Bilirubin, Total 0.5   Hemoglobin A1c     Status: None   Collection Time: 02/14/23 12:00 AM  Result Value Ref Range   Hemoglobin A1C 7.1   PSA     Status: None   Collection Time: 02/14/23 12:00 AM  Result Value Ref Range   PSA 1.2  Testosterone      Status: None   Collection Time: 02/14/23 12:00 AM  Result Value Ref Range   Testosterone  203   TSH     Status: None   Collection Time: 02/14/23 12:00 AM  Result Value Ref Range   TSH 3.50 0.41 - 5.90     CT CARDIAC SCORING (SELF PAY ONLY) Addendum Date: 10/29/2021   FINDINGS:  Coronary Calcium  Score:  Left main: 0  Left anterior descending artery: 33.6  Left circumflex artery: 19.2  Right coronary artery: 0  Total: 52.8  Percentile: 50th  Pericardium: Normal. Aortic valve calcium  score 2, trace calcifications. Ascending Aorta: Upper limit of normal caliber. Ascending aorta measures approximately 38mm at the mid ascending aorta measured in a non-contrast axial plane. Non-cardiac: See separate report from [facility].  IMPRESSION: Coronary calcium  score of 52.8. This was 50th percentile for age-, race-, and sex-matched controls.   Result Date: 10/29/2021 IMPRESSION: 1. No acute findings in the imaged extracardiac chest. 2. Hepatic steatosis.   ROS 14 pt review of systems performed and negative (unless mentioned in an HPI)  Objective: BP 123/74   Pulse 62   Ht 6\' 3"  (1.905 m)   Wt 244 lb 12.8 oz (111 kg)   SpO2 100%   BMI 30.60 kg/m  Physical Exam Vitals and nursing note reviewed.  Constitutional:      General: He is not in acute distress.    Appearance: Normal appearance. He is not ill-appearing, toxic-appearing or diaphoretic.  HENT:     Head: Normocephalic and atraumatic.     Right Ear: Tympanic membrane, ear canal and external ear normal. There is no impacted cerumen.     Left Ear: Tympanic membrane, ear canal and external ear normal. There is no impacted cerumen.     Nose: Nose normal. No congestion or rhinorrhea.     Mouth/Throat:     Mouth: Mucous membranes are moist.     Pharynx: Oropharynx is clear. No oropharyngeal exudate or posterior oropharyngeal erythema.  Eyes:     General: No scleral icterus.       Right eye: No discharge.        Left eye: No discharge.     Extraocular Movements: Extraocular movements intact.     Pupils: Pupils are equal, round, and reactive to light.  Cardiovascular:     Rate and Rhythm: Normal rate and regular rhythm.      Pulses: Normal pulses.     Heart sounds: Normal heart sounds. No murmur heard.    No friction rub. No gallop.  Pulmonary:     Effort: Pulmonary effort is normal. No respiratory distress.     Breath sounds: Normal breath sounds. No stridor. No wheezing, rhonchi or rales.  Chest:     Chest wall: No tenderness.  Abdominal:     General: Abdomen is flat. Bowel sounds are normal. There is no distension.     Palpations: Abdomen is soft. There is no mass.     Tenderness: There is no abdominal tenderness. There is no right CVA tenderness, left CVA tenderness, guarding or rebound.     Hernia: No hernia is present.  Musculoskeletal:        General: No swelling or tenderness. Normal range of motion.     Cervical back: Normal range of motion and neck supple.     Right lower leg: No edema.     Left lower leg: No edema.  Lymphadenopathy:     Cervical: No cervical adenopathy.  Skin:    General: Skin is warm and dry.     Coloration: Skin is not jaundiced.     Findings: No bruising, lesion or rash.  Neurological:     General: No focal deficit present.     Mental Status: He is alert and oriented to person, place, and time. Mental status is at baseline.     Cranial Nerves: No cranial nerve deficit.     Sensory: No sensory deficit.     Motor: No weakness.     Coordination: Coordination normal.     Gait: Gait normal.     Deep Tendon Reflexes: Reflexes normal.  Psychiatric:        Mood and Affect: Mood normal.        Behavior: Behavior normal.        Thought Content: Thought content normal.        Judgment: Judgment normal.     No results found.  Assessment/plan: Stephen Dorsey is a 63 y.o. male present for CPE and chronic Conditions/illness Management Type 2 diabetes mellitus with hyperlipidemia (HCC) Has been stable Continue metformin  1000 XR twice daily Diabetic diet Routine exercise - Microalbumin: Completed 08/05/2022.  -Encourage influenza vaccine yearly-declined today Pneumonia  declined -Foot exam completed completed today 02/21/2023 -Eye exam completed 09/10/2021- completed-requested records x2 -A1c 6.3 >7.0>6.3>6.6>6.9> 7.1   Primary hypertension/history of heart disease/abnormal stress test (2015)/obesity/grade 1 diastolic dysfunction/aortic root dilatation mild/STATIN declined Stable Continue amlodipine  2.5 mg daily. Continue lisinopril -HCTZ 40-25 mg daily. Not taking - never started Zetia , declined statin- again discussed CV risk- he is not certain he wants to take any med for CV risk. LDL not at goal <70. LDL 120 outside labs 07/13/2022. He states he is willing to start zetia  now.  - Had seen Dr. Sammi Crick many years ago, has not been seen since 2015. - CT CARDIAC SCORING: 50th percentile  Low testosterone /hormone replacement/elevated hemoglobin Managed by functional med-pellet hormone He has elevated hemoglobin and hematocrit on labs he brings in with him from [facility].  He is aware of this and has occasional therapeutic phlebotomy when levels are high..  Idiopathic small fiber peripheral neuropathy Established in the past with neurology.  Has not been seen in some time. 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  Neuropathy affects his knees to feet. Briefly discussed the use of Lyrica and Cymbalta in the past with him  and he will think about if he would like to start medication. Patient was encouraged to exercise greater than 150 minutes a week. Patient was encouraged to choose a diet filled with fresh fruits and vegetables, and lean meats. AVS provided to patient today for education/recommendation on gender specific health and safety maintenance. -Reviewed labs collected 02/14/2023 by another provider Colonoscopy: Completed, 09/2022, repeat 7 years.  Dr. Nickey Barn PSA: PSA reviewed collected 02/14/2023-on testosterone  therapy through integrative clinic Immunizations: Tdap UTD 2016, influenza-declined, Shingrix  completed Infectious disease screening: HIV and hepatitis C screenings completed  Return in about 24 weeks (around 08/08/2023) for Routine chronic condition follow-up.   Orders Placed This Encounter  Procedures   Iron, TIBC and Ferritin Panel   CBC and differential   CBC   VITAMIN D  25 Hydroxy (Vit-D Deficiency, Fractures)   Basic metabolic panel   Comprehensive metabolic panel   Lipid panel   Hepatic function panel   Hemoglobin A1c   PSA   Testosterone    TSH   Meds ordered this encounter  Medications   amLODipine  (  NORVASC ) 2.5 MG tablet    Sig: Take 1 tablet (2.5 mg total) by mouth daily.    Dispense:  30 tablet    Refill:  5   ezetimibe  (ZETIA ) 10 MG tablet    Sig: Take 1 tablet (10 mg total) by mouth at bedtime.    Dispense:  30 tablet    Refill:  5   lisinopril -hydrochlorothiazide  (ZESTORETIC ) 20-12.5 MG tablet    Sig: Take 2 tablets by mouth daily.    Dispense:  60 tablet    Refill:  5   metFORMIN  (GLUCOPHAGE -XR) 500 MG 24 hr tablet    Sig: Take 2 tablets (1,000 mg total) by mouth daily with supper.    Dispense:  60 tablet    Refill:  5   Referral Orders  No referral(s) requested today     Note is dictated utilizing voice recognition software. Although note has been proof read prior to signing, occasional typographical errors still can be missed. If any questions arise, please do not hesitate to call for verification.  Electronically signed by: Napolean Backbone, DO [facility] [place]

## 2023-03-01 ENCOUNTER — Other Ambulatory Visit (HOSPITAL_COMMUNITY): Payer: Self-pay

## 2023-03-01 MED ORDER — TESTOSTERONE CYPIONATE 200 MG/ML IM SOLN
75.0000 mg | INTRAMUSCULAR | 5 refills | Status: DC
  Filled 2023-03-01: qty 4, 28d supply, fill #0
  Filled 2023-04-16 – 2023-04-18 (×2): qty 4, 28d supply, fill #1
  Filled 2023-05-13 – 2023-05-16 (×2): qty 4, 28d supply, fill #2
  Filled 2023-06-05 – 2023-06-11 (×3): qty 4, 28d supply, fill #3
  Filled 2023-07-04 (×2): qty 4, 28d supply, fill #4
  Filled 2023-08-24 (×2): qty 4, 28d supply, fill #5
  Filled ????-??-??: fill #3

## 2023-04-18 ENCOUNTER — Other Ambulatory Visit (HOSPITAL_COMMUNITY): Payer: Self-pay

## 2023-05-12 DIAGNOSIS — L570 Actinic keratosis: Secondary | ICD-10-CM | POA: Diagnosis not present

## 2023-05-12 DIAGNOSIS — L578 Other skin changes due to chronic exposure to nonionizing radiation: Secondary | ICD-10-CM | POA: Diagnosis not present

## 2023-05-12 DIAGNOSIS — L814 Other melanin hyperpigmentation: Secondary | ICD-10-CM | POA: Diagnosis not present

## 2023-05-12 DIAGNOSIS — L821 Other seborrheic keratosis: Secondary | ICD-10-CM | POA: Diagnosis not present

## 2023-05-12 DIAGNOSIS — D225 Melanocytic nevi of trunk: Secondary | ICD-10-CM | POA: Diagnosis not present

## 2023-05-13 ENCOUNTER — Other Ambulatory Visit (HOSPITAL_COMMUNITY): Payer: Self-pay

## 2023-05-16 ENCOUNTER — Other Ambulatory Visit (HOSPITAL_COMMUNITY): Payer: Self-pay

## 2023-05-16 ENCOUNTER — Encounter (HOSPITAL_COMMUNITY): Payer: Self-pay

## 2023-06-07 ENCOUNTER — Other Ambulatory Visit (HOSPITAL_COMMUNITY): Payer: Self-pay

## 2023-06-11 ENCOUNTER — Other Ambulatory Visit (HOSPITAL_COMMUNITY): Payer: Self-pay

## 2023-07-04 ENCOUNTER — Other Ambulatory Visit (HOSPITAL_COMMUNITY): Payer: Self-pay

## 2023-08-08 ENCOUNTER — Encounter: Payer: Self-pay | Admitting: Family Medicine

## 2023-08-08 ENCOUNTER — Ambulatory Visit (INDEPENDENT_AMBULATORY_CARE_PROVIDER_SITE_OTHER): Payer: 59 | Admitting: Family Medicine

## 2023-08-08 VITALS — BP 122/82 | HR 67 | Temp 97.9°F | Wt 240.8 lb

## 2023-08-08 DIAGNOSIS — E66811 Obesity, class 1: Secondary | ICD-10-CM | POA: Diagnosis not present

## 2023-08-08 DIAGNOSIS — I77810 Thoracic aortic ectasia: Secondary | ICD-10-CM

## 2023-08-08 DIAGNOSIS — I5189 Other ill-defined heart diseases: Secondary | ICD-10-CM

## 2023-08-08 DIAGNOSIS — E1169 Type 2 diabetes mellitus with other specified complication: Secondary | ICD-10-CM | POA: Diagnosis not present

## 2023-08-08 DIAGNOSIS — Z7984 Long term (current) use of oral hypoglycemic drugs: Secondary | ICD-10-CM | POA: Diagnosis not present

## 2023-08-08 DIAGNOSIS — I10 Essential (primary) hypertension: Secondary | ICD-10-CM

## 2023-08-08 DIAGNOSIS — E785 Hyperlipidemia, unspecified: Secondary | ICD-10-CM

## 2023-08-08 DIAGNOSIS — R7989 Other specified abnormal findings of blood chemistry: Secondary | ICD-10-CM

## 2023-08-08 DIAGNOSIS — D582 Other hemoglobinopathies: Secondary | ICD-10-CM

## 2023-08-08 LAB — POCT GLYCOSYLATED HEMOGLOBIN (HGB A1C)
HbA1c POC (<> result, manual entry): 6.3 % (ref 4.0–5.6)
HbA1c, POC (controlled diabetic range): 6.3 % (ref 0.0–7.0)
HbA1c, POC (prediabetic range): 6.3 % (ref 5.7–6.4)
Hemoglobin A1C: 6.3 % — AB (ref 4.0–5.6)

## 2023-08-08 LAB — MICROALBUMIN / CREATININE URINE RATIO
Creatinine,U: 110.4 mg/dL
Microalb Creat Ratio: 10.8 mg/g (ref 0.0–30.0)
Microalb, Ur: 1.2 mg/dL (ref 0.0–1.9)

## 2023-08-08 MED ORDER — LISINOPRIL 40 MG PO TABS: 40.0000 mg | ORAL_TABLET | Freq: Every day | ORAL | 5 refills | Status: DC

## 2023-08-08 MED ORDER — METFORMIN HCL ER 500 MG PO TB24: 1000.0000 mg | ORAL_TABLET | Freq: Every day | ORAL | 5 refills | Status: AC

## 2023-08-08 MED ORDER — EZETIMIBE 10 MG PO TABS: 10.0000 mg | ORAL_TABLET | Freq: Every day | ORAL | 5 refills | Status: AC

## 2023-08-08 MED ORDER — AMLODIPINE BESYLATE 2.5 MG PO TABS: 2.5000 mg | ORAL_TABLET | Freq: Every day | ORAL | 5 refills | Status: AC

## 2023-08-08 NOTE — Progress Notes (Signed)
 Patient ID: Stephen Dorsey, male  DOB: 03/24/1960, 63 y.o.   MRN: [id number] Patient Care Team    Relationship Specialty Notifications Start End  Catherine Charlies LABOR, DO PCP - General Family Medicine  09/23/21   Rollin Dover, MD Consulting Physician Gastroenterology  09/23/21   Raelyn Harlene DEL, OD  Optometry  09/23/21   Delores Doughty, DC  Chiropractic Medicine  09/23/21   Margaret Eduard SAUNDERS, MD Consulting Physician Neurology  09/23/21   Neysa Sensor, MD    09/23/21    Comment: Functional med  Towana Fonda RAMAN, MD Referring Physician Dermatology  09/24/21     Chief Complaint  Patient presents with   Diabetes    Chronic Conditions/illness Management Pt is fasting.     Subjective:  Stephen Dorsey is a 63 y.o. male present for forchronic Conditions/illness Management All past medical history, surgical history, allergies, family history, immunizations, medications and social history were updated in the electronic medical record today. All recent labs, ED visits and hospitalizations within the last year were reviewed.   Diabetes/HLD/obesity Pt reports compliance with metformin  500 XR BID.  Patient denies dizziness, hyperglycemic or hypoglycemic events. Patient denies numbness, tingling in the extremities or nonhealing wounds of feet.  The has a history of idiopathic small fiber neuropathy of his lower extremities.   He reports he has been exercising more.  Low testosterone /warm replacement therapy/elevated hemoglobin-secondary polycythemia Managed by functional med-pellet hormone therapy.  He reports he has phlebotomies when his hemoglobin becomes elevated. He would like CBC tested today.  Idiopathic small fiber peripheral neuropathy Has established with neurology in 2019. 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  Neuropathy affects his knees to feet.  Hypertension/HLD/h/o abnormal stress test (2015)/obesity/diastolic dysfunction-grade  1/aortic root dilatation-mild Pt reports compliance with lisinopril - HCTZ 40-25 and amlodipine  2.5 mg daily.   Patient denies chest pain, shortness of breath, or lower extremity edema.  He brings in blood pressures today that ranged from high 90s systolic to 115 systolic.  Reports occasional dizziness when bending over.  He would like to see if we can decrease his blood pressure regimen. Pt does not take a daily baby ASA. Pt is not prescribed statin due to choice.  After cardiac CT completed he was agreeable to start Zetia .  Patient reports compliance with Zetia  RF: HTN, obesity, DM, FH heart disease, abnormal stress test/cardiac CT score 52.8/50th percentile  STUDIES: Cardiac CT 2023: FINDINGS:  Coronary Calcium  Score:  Left main: 0  Left anterior descending artery: 33.6  Left circumflex artery: 19.2  Right coronary artery: 0  Total: 52.8  Percentile: 50th   Myocardial perfusion image 4/8//2015: Exercise Capacity:  Good exercise capacity. BP Response:  Hypotensive blood pressure response. Clinical Symptoms:  There is dyspnea. ECG Impression:  There are scattered PVCs. Comparison with Prior Nuclear Study: No previous nuclear study performed Overall Impression:  Intermediate risk stress nuclear study with moderate-sized area of partially reversible inferoseptal ischemia (SDS 5, Extent 7%) There was a hypotensive response to exercise. PVC's were noted, but no diagnostic ST segment depression was appreciated.] LV Wall Motion:  EF 63%, basal inferoseptal hypokinesis.  Echocardiogram 03/20/2013: - Left ventricle: The cavity size was mildly dilated. Wall    thickness was normal. Systolic function was normal. The    estimated ejection fraction was in the range of 55% to    60%. Wall motion was normal; there were no regional wall    motion abnormalities. Doppler  parameters are consistent    with abnormal left ventricular relaxation (grade 1    diastolic dysfunction).  - Aortic valve:  Trivial regurgitation.  - Aortic root: The aortic root was mildly dilated.  - Left atrium: The atrium was mildly dilated.   Lower extremity arterial Doppler 02/22/2013: Normal bilaterally      08/08/2023    9:06 AM 02/21/2023    9:04 AM 08/05/2022    8:45 AM 09/23/2021    8:42 AM  Depression screen PHQ 2/9  Decreased Interest 0 0 0 0  Down, Depressed, Hopeless 0 0 0 0  PHQ - 2 Score 0 0 0 0  Altered sleeping 0     Tired, decreased energy 0     Change in appetite 0     Feeling bad or failure about yourself  0     Trouble concentrating 0     Moving slowly or fidgety/restless 0     Suicidal thoughts 0     PHQ-9 Score 0     Difficult doing work/chores Not difficult at all         08/08/2023    9:06 AM 02/21/2023    9:04 AM  GAD 7 : Generalized Anxiety Score  Nervous, Anxious, on Edge 0 0  Control/stop worrying 0 0  Worry too much - different things 0 0  Trouble relaxing 0 0  Restless 0 0  Easily annoyed or irritable 0 0  Afraid - awful might happen 0 0  Total GAD 7 Score 0 0  Anxiety Difficulty Not difficult at all Not difficult at all          08/08/2023    9:06 AM 08/05/2022    8:45 AM 03/31/2016    2:27 PM  Fall Risk   Falls in the past year? 0 0 No   Number falls in past yr:  0   Injury with Fall?  0   Follow up Falls evaluation completed Falls evaluation completed      Data saved with a previous flowsheet row definition    Immunization History  Administered Date(s) Administered   PFIZER(Purple Top)SARS-COV-2 Vaccination 05/10/2019, 06/04/2019   Tdap 07/03/2014   Zoster Recombinant(Shingrix) 01/08/2020, 03/14/2020   Past Medical History:  Diagnosis Date   Cataract 01/11/2021   Very slight imparement   Colon polyps    By colonoscopy in 2009    Decreased pedal pulses    Diabetes (HCC)    GERD (gastroesophageal reflux disease) 01/11/2021   Occasionally   HLD (hyperlipidemia)    Hypertension    Idiopathic small fiber sensory neuropathy 2008   Dx in 2008.  Getting worse, has trouble feeling his feet. No Sx's in arms.   Kidney stone 1998   Low testosterone     Pneumonia due to COVID-19 virus 02/17/2019   No Known Allergies Past Surgical History:  Procedure Laterality Date   KIDNEY STONE SURGERY     Family History  Problem Relation Age of Onset   Thyroid  disease Mother        thyroid  problems   Heart attack Mother    Hypertension Father    Hyperlipidemia Father    Lung cancer Father    Thyroid  disease Sister    Arthritis Sister    Heart attack Maternal Grandmother    Gaucher's disease Maternal Grandmother    Heart attack Maternal Grandfather    Diabetes Maternal Grandfather    Hearing loss Maternal Grandfather    Arthritis Paternal Grandmother    Hearing  loss Paternal Grandmother    Heart attack Paternal Grandmother    Heart attack Paternal Grandfather    Social History   Social History Narrative   Marital status/children/pets: Married.   Education/employment: Works for [employer].  Education 4 yr college.  BSEE.   Safety:      -smoke alarm in the home:Yes     - wears seatbelt: Yes     - Feels safe in their relationships: Yes       Allergies as of 08/08/2023   No Known Allergies      Medication List        Accurate as of August 08, 2023 10:27 AM. If you have any questions, ask your nurse or doctor.          STOP taking these medications    lisinopril -hydrochlorothiazide  20-12.5 MG tablet Commonly known as: ZESTORETIC  Stopped by: Charlies Bellini       TAKE these medications    amLODipine  2.5 MG tablet Commonly known as: NORVASC  Take 1 tablet (2.5 mg total) by mouth daily.   ezetimibe  10 MG tablet Commonly known as: Zetia  Take 1 tablet (10 mg total) by mouth at bedtime.   fluorouracil 5 % cream Commonly known as: EFUDEX Apply topically 2 (two) times daily.   lisinopril  40 MG tablet Commonly known as: ZESTRIL  Take 1 tablet (40 mg total) by mouth daily. Started by: Charlies Bellini   metFORMIN  500  MG 24 hr tablet Commonly known as: GLUCOPHAGE -XR Take 2 tablets (1,000 mg total) by mouth daily with supper.   tadalafil  10 MG tablet Commonly known as: Cialis  Take 1 - 2 tablets as needed prior to sexual activity   testosterone  cypionate 200 MG/ML injection Commonly known as: DEPOTESTOSTERONE CYPIONATE Inject 0.375 mLs (75 mg total) into the muscle once a week. Discard single use vial every week       All past medical history, surgical history, allergies, family history, immunizations andmedications were updated in the EMR today and reviewed under the history and medication portions of their EMR.        CT CARDIAC SCORING (SELF PAY ONLY) Addendum Date: 10/29/2021   FINDINGS:  Coronary Calcium  Score:  Left main: 0  Left anterior descending artery: 33.6  Left circumflex artery: 19.2  Right coronary artery: 0  Total: 52.8  Percentile: 50th  Pericardium: Normal. Aortic valve calcium  score 2, trace calcifications. Ascending Aorta: Upper limit of normal caliber. Ascending aorta measures approximately 38mm at the mid ascending aorta measured in a non-contrast axial plane. Non-cardiac: See separate report from [facility].  IMPRESSION: Coronary calcium  score of 52.8. This was 50th percentile for age-, race-, and sex-matched controls.   Result Date: 10/29/2021 IMPRESSION: 1. No acute findings in the imaged extracardiac chest. 2. Hepatic steatosis.   ROS 14 pt review of systems performed and negative (unless mentioned in an HPI)  Objective: BP 122/82   Pulse 67   Temp 97.9 F (36.6 C)   Wt 240 lb 12.8 oz (109.2 kg)   SpO2 97%   BMI 30.10 kg/m  Physical Exam Vitals and nursing note reviewed. Exam conducted with a chaperone present.  Constitutional:      General: He is not in acute distress.    Appearance: Normal appearance. He is not ill-appearing, toxic-appearing or diaphoretic.  HENT:     Head: Normocephalic and atraumatic.  Eyes:     General: No scleral  icterus.       Right eye: No discharge.  Left eye: No discharge.     Extraocular Movements: Extraocular movements intact.     Pupils: Pupils are equal, round, and reactive to light.  Cardiovascular:     Rate and Rhythm: Normal rate and regular rhythm.     Heart sounds: No murmur heard. Pulmonary:     Effort: Pulmonary effort is normal. No respiratory distress.     Breath sounds: Normal breath sounds. No wheezing, rhonchi or rales.  Musculoskeletal:     Cervical back: Neck supple.     Right lower leg: No edema.     Left lower leg: No edema.  Skin:    General: Skin is warm.     Findings: No rash.  Neurological:     Mental Status: He is alert and oriented to person, place, and time. Mental status is at baseline.  Psychiatric:        Mood and Affect: Mood normal.        Behavior: Behavior normal.        Thought Content: Thought content normal.        Judgment: Judgment normal.    Diabetic Foot Exam - Simple   Simple Foot Form Diabetic Foot exam was performed with the following findings: Yes 08/08/2023  9:12 AM  Visual Inspection No deformities, no ulcerations, no other skin breakdown bilaterally: Yes Sensation Testing Intact to touch and monofilament testing bilaterally: Yes Pulse Check Posterior Tibialis and Dorsalis pulse intact bilaterally: Yes Comments    Patient was encouraged to exercise greater than 150 minutes a week. Patient was encouraged to choose a diet filled with fresh fruits and vegetables, and lean meats. AVS provided to patient today for education/recommendation on gender specific health and safety maintenance.  No results found.  Results for orders placed or performed in visit on 08/08/23 (from the past 48 hours)  POCT HgB A1C     Status: Abnormal   Collection Time: 08/08/23  9:10 AM  Result Value Ref Range   Hemoglobin A1C 6.3 (A) 4.0 - 5.6 %   HbA1c POC (<> result, manual entry) 6.3 4.0 - 5.6 %   HbA1c, POC (prediabetic range) 6.3 5.7 - 6.4 %    HbA1c, POC (controlled diabetic range) 6.3 0.0 - 7.0 %    Assessment/plan: DAVONNE JARNIGAN is a 63 y.o. male present for Conditions/illness Management Type 2 diabetes mellitus with hyperlipidemia (HCC) Has been stable Continue metformin  1000 XR twice daily Diabetic diet Routine exercise - Microalbumin: Completed 08/05/2022.  -Encourage influenza vaccine yearly-declined today - Pneumonia declined -Foot exam completed completed today 08/08/2023 -Eye exam completed 03/2022- completed-requested records x2 -A1c 6.3 >7.0>6.3>6.6>6.9> 7.1 > 6.3 collected today  Primary hypertension/history of heart disease/abnormal stress test (2015)/obesity/grade 1 diastolic dysfunction/aortic root dilatation mild/STATIN declined Pressures are well-controlled here today.  However he is experiencing some dizziness and lower pressures at home per his report Continue amlodipine  2.5 mg daily. Discontinue the HCTZ portion of his medication.  He will monitor for any fluid retention. Continue lisinopril  40 mg daily Continue Zetia   -Has been established with Dr. Karie - CT CARDIAC SCORING: 52.8; 50th percentile  Low testosterone /hormone replacement/elevated hemoglobin Managed by functional med-pellet hormone He has elevated hemoglobin and hematocrit on labs he brings in with him from [facility].  He is aware of this and has occasional therapeutic phlebotomy when levels are high..  Idiopathic small fiber peripheral neuropathy Established in the past with neurology.  Has not been seen in some time. At that time they had suspected patient has a  hereditary sensory-motor neuropathy (HSMN).  Neuropathy affects his knees to feet. Briefly discussed the use of Lyrica and Cymbalta in the past with him  and he will think about if he would like to start medication.  Return in about 7 months (around 02/22/2024) for cpe (20 min), Routine chronic condition follow-up.   Orders Placed This Encounter  Procedures   Urine  Microalbumin w/creat. ratio   CBC   POCT HgB A1C   Meds ordered this encounter  Medications   amLODipine  (NORVASC ) 2.5 MG tablet    Sig: Take 1 tablet (2.5 mg total) by mouth daily.    Dispense:  30 tablet    Refill:  5   ezetimibe  (ZETIA ) 10 MG tablet    Sig: Take 1 tablet (10 mg total) by mouth at bedtime.    Dispense:  30 tablet    Refill:  5   metFORMIN  (GLUCOPHAGE -XR) 500 MG 24 hr tablet    Sig: Take 2 tablets (1,000 mg total) by mouth daily with supper.    Dispense:  60 tablet    Refill:  5   lisinopril  (ZESTRIL ) 40 MG tablet    Sig: Take 1 tablet (40 mg total) by mouth daily.    Dispense:  30 tablet    Refill:  5   Referral Orders  No referral(s) requested today     Note is dictated utilizing voice recognition software. Although note has been proof read prior to signing, occasional typographical errors still can be missed. If any questions arise, please do not hesitate to call for verification.  Electronically signed by: Charlies Bellini, DO [facility]

## 2023-08-08 NOTE — Patient Instructions (Addendum)

## 2023-08-09 ENCOUNTER — Ambulatory Visit: Payer: Self-pay | Admitting: Family Medicine

## 2023-08-09 LAB — CBC
Hematocrit: 58.2 % — ABNORMAL HIGH (ref 37.5–51.0)
Hemoglobin: 18.6 g/dL — ABNORMAL HIGH (ref 13.0–17.7)
MCH: 29 pg (ref 26.6–33.0)
MCHC: 32 g/dL (ref 31.5–35.7)
MCV: 91 fL (ref 79–97)
Platelets: 149 x10E3/uL — ABNORMAL LOW (ref 150–450)
RBC: 6.42 x10E6/uL — ABNORMAL HIGH (ref 4.14–5.80)
RDW: 13.5 % (ref 11.6–15.4)
WBC: 7.5 x10E3/uL (ref 3.4–10.8)

## 2023-08-24 ENCOUNTER — Other Ambulatory Visit (HOSPITAL_COMMUNITY): Payer: Self-pay

## 2023-08-25 ENCOUNTER — Other Ambulatory Visit: Payer: Self-pay

## 2023-08-25 ENCOUNTER — Other Ambulatory Visit (HOSPITAL_BASED_OUTPATIENT_CLINIC_OR_DEPARTMENT_OTHER): Payer: Self-pay

## 2023-08-25 ENCOUNTER — Other Ambulatory Visit (HOSPITAL_COMMUNITY): Payer: Self-pay

## 2023-09-19 ENCOUNTER — Other Ambulatory Visit (HOSPITAL_COMMUNITY): Payer: Self-pay

## 2023-09-21 ENCOUNTER — Other Ambulatory Visit (HOSPITAL_COMMUNITY): Payer: Self-pay

## 2023-09-22 ENCOUNTER — Other Ambulatory Visit (HOSPITAL_COMMUNITY): Payer: Self-pay

## 2023-09-27 ENCOUNTER — Other Ambulatory Visit (HOSPITAL_COMMUNITY): Payer: Self-pay

## 2023-09-27 ENCOUNTER — Encounter (HOSPITAL_COMMUNITY): Payer: Self-pay

## 2023-09-27 MED ORDER — TESTOSTERONE CYPIONATE 200 MG/ML IM SOLN
100.0000 mg | INTRAMUSCULAR | 1 refills | Status: DC
  Filled 2023-09-27: qty 4, 28d supply, fill #0
  Filled 2023-10-17 – 2023-10-25 (×3): qty 4, 28d supply, fill #1

## 2023-10-17 ENCOUNTER — Other Ambulatory Visit: Payer: Self-pay

## 2023-10-17 ENCOUNTER — Other Ambulatory Visit (HOSPITAL_COMMUNITY): Payer: Self-pay

## 2023-10-25 ENCOUNTER — Other Ambulatory Visit (HOSPITAL_COMMUNITY): Payer: Self-pay

## 2023-11-07 ENCOUNTER — Other Ambulatory Visit (HOSPITAL_COMMUNITY): Payer: Self-pay

## 2023-11-07 MED ORDER — TESTOSTERONE CYPIONATE 200 MG/ML IM SOLN
100.0000 mg | INTRAMUSCULAR | 0 refills | Status: DC
  Filled 2023-11-07 – 2023-11-22 (×2): qty 4, 28d supply, fill #0

## 2023-11-22 ENCOUNTER — Other Ambulatory Visit (HOSPITAL_COMMUNITY): Payer: Self-pay

## 2023-11-24 ENCOUNTER — Other Ambulatory Visit (HOSPITAL_COMMUNITY): Payer: Self-pay

## 2023-11-24 MED ORDER — TESTOSTERONE CYPIONATE 200 MG/ML IM SOLN
100.0000 mg | INTRAMUSCULAR | 5 refills | Status: AC
  Filled 2023-12-26: qty 4, 28d supply, fill #0
  Filled 2024-01-28 – 2024-01-30 (×2): qty 4, 28d supply, fill #1

## 2023-12-13 DIAGNOSIS — D2371 Other benign neoplasm of skin of right lower limb, including hip: Secondary | ICD-10-CM | POA: Diagnosis not present

## 2023-12-13 DIAGNOSIS — L578 Other skin changes due to chronic exposure to nonionizing radiation: Secondary | ICD-10-CM | POA: Diagnosis not present

## 2023-12-13 DIAGNOSIS — D225 Melanocytic nevi of trunk: Secondary | ICD-10-CM | POA: Diagnosis not present

## 2023-12-13 DIAGNOSIS — L814 Other melanin hyperpigmentation: Secondary | ICD-10-CM | POA: Diagnosis not present

## 2023-12-13 DIAGNOSIS — L570 Actinic keratosis: Secondary | ICD-10-CM | POA: Diagnosis not present

## 2023-12-13 DIAGNOSIS — L821 Other seborrheic keratosis: Secondary | ICD-10-CM | POA: Diagnosis not present

## 2023-12-13 DIAGNOSIS — W908XXS Exposure to other nonionizing radiation, sequela: Secondary | ICD-10-CM | POA: Diagnosis not present

## 2023-12-27 ENCOUNTER — Other Ambulatory Visit: Payer: Self-pay

## 2023-12-27 ENCOUNTER — Other Ambulatory Visit (HOSPITAL_COMMUNITY): Payer: Self-pay

## 2024-01-23 ENCOUNTER — Ambulatory Visit
Admission: EM | Admit: 2024-01-23 | Discharge: 2024-01-23 | Disposition: A | Discharge status: Home / Self Care | Attending: Family Medicine | Admitting: Family Medicine

## 2024-01-23 ENCOUNTER — Ambulatory Visit

## 2024-01-23 ENCOUNTER — Telehealth: Payer: Self-pay

## 2024-01-23 DIAGNOSIS — R109 Unspecified abdominal pain: Secondary | ICD-10-CM

## 2024-01-23 DIAGNOSIS — R10A1 Flank pain, right side: Secondary | ICD-10-CM | POA: Diagnosis not present

## 2024-01-23 DIAGNOSIS — R10A0 Flank pain, unspecified side: Secondary | ICD-10-CM | POA: Diagnosis not present

## 2024-01-23 DIAGNOSIS — R319 Hematuria, unspecified: Secondary | ICD-10-CM

## 2024-01-23 LAB — POCT URINE DIPSTICK
Bilirubin, UA: NEGATIVE
Glucose, UA: NEGATIVE mg/dL
Ketones, POC UA: NEGATIVE mg/dL
Leukocytes, UA: NEGATIVE
Nitrite, UA: NEGATIVE
POC PROTEIN,UA: NEGATIVE
Spec Grav, UA: 1.01
Urobilinogen, UA: 0.2 U/dL
pH, UA: 6

## 2024-01-23 MED ORDER — TAMSULOSIN HCL 0.4 MG PO CAPS: 0.4000 mg | ORAL_CAPSULE | Freq: Every day | ORAL | 0 refills | Status: AC

## 2024-01-23 MED ORDER — OXYCODONE-ACETAMINOPHEN 5-325 MG PO TABS: 1.0000 | ORAL_TABLET | Freq: Four times a day (QID) | ORAL | 0 refills | Status: DC | PRN

## 2024-01-23 NOTE — Discharge Instructions (Addendum)
 Called and spoke with the patient via telephone to advise there is a 4 x 7 mm right upper uterteric calculus causing mild proximal obstructive uropathy.  There are 2 calculi in right kidney with largest measuring up to 4 mm also there is a single 1 mm nonobstructive callus of left kidney lower pole.  Additional bilateral nonobstructive renal calculi as described above.  Advised patient Percocet and tamsulosin  has been sent to his pharmacy.  Advised please follow-up with your PCP for further evaluation.

## 2024-01-23 NOTE — ED Provider Notes (Signed)
 " [facility]    CSN: [id number] Arrival date & time: 01/23/24  0931      History   Chief Complaint Chief Complaint  Patient presents with   Abdominal Pain    HPI DAYSEAN TINKHAM is a 64 y.o. male.   HPI 64 year old male presents with right-sided lower abdominal pain radiating to right lower flank for 3 days.  PMH significant for obesity, T2DM with HLD, and diastolic dysfunction grade 1.  Past Medical History:  Diagnosis Date   Cataract 01/11/2021   Very slight imparement   Colon polyps    By colonoscopy in 2009    Decreased pedal pulses    Diabetes (HCC)    GERD (gastroesophageal reflux disease) 01/11/2021   Occasionally   HLD (hyperlipidemia)    Hypertension    Idiopathic small fiber sensory neuropathy 2008   Dx in 2008. Getting worse, has trouble feeling his feet. No Sx's in arms.   Kidney stone 1998   Low testosterone     Pneumonia due to COVID-19 virus 02/17/2019    Patient Active Problem List   Diagnosis Date Noted   Polyp of colon 09/24/2021   Long-term current use of testosterone  replacement therapy 09/24/2021   Elevated hemoglobin 09/24/2021   Primary hypertension 09/24/2021   Low testosterone  09/24/2021   Idiopathic small fiber peripheral neuropathy 09/24/2021   FH: heart disease 09/24/2021   Diastolic dysfunction-grade 1 09/24/2021   Aortic root dilatation (HCC)-mild 09/24/2021   E66.01 (HCC) 09/24/2021   Obesity (BMI 30.0-34.9) 09/24/2021   Type 2 diabetes mellitus with hyperlipidemia (HCC)- ldl goal <70 05/10/2013   Abnormal stress test 05/10/2013    Past Surgical History:  Procedure Laterality Date   KIDNEY STONE SURGERY         Home Medications    Prior to Admission medications  Medication Sig Start Date End Date Taking? Authorizing Provider  oxyCODONE -acetaminophen  (PERCOCET/ROXICET) 5-325 MG tablet Take 1 tablet by mouth every 6 (six) hours as needed for severe pain (pain score 7-10). 01/23/24  Yes Teddy Sharper, FNP   tamsulosin  (FLOMAX ) 0.4 MG CAPS capsule Take 1 capsule (0.4 mg total) by mouth daily. 01/23/24  Yes Teddy Sharper, FNP  amLODipine  (NORVASC ) 2.5 MG tablet Take 1 tablet (2.5 mg total) by mouth daily. 08/08/23   Kuneff, Renee A, DO  ezetimibe  (ZETIA ) 10 MG tablet Take 1 tablet (10 mg total) by mouth at bedtime. 08/08/23   Kuneff, Renee A, DO  fluorouracil (EFUDEX) 5 % cream Apply topically 2 (two) times daily. 11/04/22   [provider]  lisinopril  (ZESTRIL ) 40 MG tablet Take 1 tablet (40 mg total) by mouth daily. 08/08/23   Kuneff, Renee A, DO  metFORMIN  (GLUCOPHAGE -XR) 500 MG 24 hr tablet Take 2 tablets (1,000 mg total) by mouth daily with supper. 08/08/23   Kuneff, Renee A, DO  tadalafil  (CIALIS ) 10 MG tablet Take 1 - 2 tablets as needed prior to sexual activity 08/31/21     testosterone  cypionate (DEPOTESTOSTERONE CYPIONATE) 200 MG/ML injection Inject 0.375 mLs (75 mg total) into the muscle once a week. Discard single use vial every week 03/01/23     testosterone  cypionate (DEPOTESTOSTERONE CYPIONATE) 200 MG/ML injection Inject 0.5 mLs (100 mg total) into the muscle once a week. **single use vial per md** 11/24/23       Family History Family History  Problem Relation Age of Onset   Thyroid  disease Mother        thyroid  problems   Heart attack Mother  Hypertension Father    Hyperlipidemia Father    Lung cancer Father    Thyroid  disease Sister    Arthritis Sister    Heart attack Maternal Grandmother    Gaucher's disease Maternal Grandmother    Heart attack Maternal Grandfather    Diabetes Maternal Grandfather    Hearing loss Maternal Grandfather    Arthritis Paternal Grandmother    Hearing loss Paternal Grandmother    Heart attack Paternal Grandmother    Heart attack Paternal Grandfather     Social History Social History[1]   Allergies   Patient has no known allergies.   Review of Systems Review of Systems  Gastrointestinal:  Positive for abdominal pain.   Genitourinary:  Positive for flank pain.     Physical Exam Triage Vital Signs ED Triage Vitals  Encounter Vitals Group     BP 01/23/24 0944 (!) 173/92     Girls Systolic BP Percentile --      Girls Diastolic BP Percentile --      Boys Systolic BP Percentile --      Boys Diastolic BP Percentile --      Pulse Rate 01/23/24 0944 85     Resp 01/23/24 0944 17     Temp 01/23/24 0944 97.6 F (36.4 C)     Temp Source 01/23/24 0944 Oral     SpO2 01/23/24 0944 99 %     Weight --      Height --      Head Circumference --      Peak Flow --      Pain Score 01/23/24 0945 1     Pain Loc --      Pain Education --      Exclude from Growth Chart --    No data found.  Updated Vital Signs BP (!) 173/92 (BP Location: Right Arm)   Pulse 85   Temp 97.6 F (36.4 C) (Oral)   Resp 17   SpO2 99%    Physical Exam Vitals and nursing note reviewed.  Constitutional:      General: He is not in acute distress.    Appearance: He is well-developed. He is obese. He is not ill-appearing.  HENT:     Head: Normocephalic and atraumatic.     Mouth/Throat:     Mouth: Mucous membranes are moist.     Pharynx: Oropharynx is clear.  Eyes:     Extraocular Movements: Extraocular movements intact.     Pupils: Pupils are equal, round, and reactive to light.  Cardiovascular:     Rate and Rhythm: Normal rate and regular rhythm.     Heart sounds: No murmur heard. Pulmonary:     Effort: Pulmonary effort is normal.     Breath sounds: Normal breath sounds. No wheezing, rhonchi or rales.  Abdominal:     General: Abdomen is flat. Bowel sounds are absent. There is no distension.     Palpations: There is no shifting dullness, fluid wave, hepatomegaly, splenomegaly, mass or pulsatile mass.     Tenderness: There is abdominal tenderness in the right lower quadrant and left lower quadrant. There is right CVA tenderness. There is no left CVA tenderness, guarding or rebound. Negative signs include Murphy's sign,  Rovsing's sign and McBurney's sign.     Hernia: No hernia is present.  Skin:    General: Skin is warm and dry.  Neurological:     General: No focal deficit present.     Mental Status: He is alert and oriented  to person, place, and time.  Psychiatric:        Mood and Affect: Mood normal.        Behavior: Behavior normal.      UC Treatments / Results  Labs (all labs ordered are listed, but only abnormal results are displayed) Labs Reviewed  POCT URINE DIPSTICK - Abnormal; Notable for the following components:      Result Value   Blood, UA small (*)    All other components within normal limits    EKG   Radiology CT Renal Stone Study Result Date: 01/23/2024 CLINICAL DATA:  Abdominal/flank pain, stone suspected. EXAM: CT ABDOMEN AND PELVIS WITHOUT CONTRAST TECHNIQUE: Multidetector CT imaging of the abdomen and pelvis was performed following the standard protocol without IV contrast. RADIATION DOSE REDUCTION: This exam was performed according to the departmental dose-optimization program which includes automated exposure control, adjustment of the mA and/or kV according to patient size and/or use of iterative reconstruction technique. COMPARISON:  None Available. FINDINGS: Lower chest: The lung bases are clear. No pleural effusion. The heart is normal in size. No pericardial effusion. Hepatobiliary: The liver is normal in size. Non-cirrhotic configuration. No suspicious mass. No intrahepatic or extrahepatic bile duct dilation. No calcified gallstones. Normal gallbladder wall thickness. No pericholecystic inflammatory changes. Pancreas: Unremarkable. No pancreatic ductal dilatation or surrounding inflammatory changes. Spleen: Within normal limits. No focal lesion. Adrenals/Urinary Tract: Adrenal glands are unremarkable. No suspicious renal mass within the limitations of this unenhanced exam. There is a 4 x 7 mm right upper ureteric calculus causing mild proximal obstructive uropathy. Right  ureter distal to the calculus is unremarkable. There are 2 calculi in the right kidney with largest measuring up to 4 mm. There is a single 1 mm nonobstructing calculus in the left kidney lower pole calyx. No other left nephroureterolithiasis or obstructive uropathy. Urinary bladder is under distended, precluding optimal assessment. However, no large mass or stones identified. No perivesical fat stranding. Stomach/Bowel: No disproportionate dilation of the small or large bowel loops. No evidence of abnormal bowel wall thickening or inflammatory changes. The appendix is unremarkable. There are scattered diverticula mainly in the sigmoid colon, without imaging signs of diverticulitis. Vascular/Lymphatic: No ascites or pneumoperitoneum. No abdominal or pelvic lymphadenopathy, by size criteria. No aneurysmal dilation of the major abdominal arteries. There are mild peripheral atherosclerotic vascular calcifications of the aorta and its major branches. Reproductive: Top normal prostate size. Symmetric bilateral seminal vesicles. Other: There are fat containing umbilical and bilateral inguinal hernias. The soft tissues and abdominal wall are otherwise unremarkable. Musculoskeletal: No suspicious osseous lesions. There are mild - moderate multilevel degenerative changes in the visualized spine. Bilateral L5 spondylolysis noted with associated resultant grade 1 anterolisthesis of L5 over S1. IMPRESSION: 1. There is a 4 x 7 mm right upper ureteric calculus causing mild proximal obstructive uropathy. There are additional bilateral nonobstructing renal calculi, as described above. 2. Top-normal prostate size and bilateral L5 spondylolysis, as described above. 3. Otherwise essentially unremarkable exam, as described above. Aortic Atherosclerosis (ICD10-I70.0). Electronically Signed   By: Ree Molt M.D.   On: 01/23/2024 11:39    Procedures Procedures (including critical care time)  Medications Ordered in  UC Medications - No data to display  Initial Impression / Assessment and Plan / UC Course  I have reviewed the triage vital signs and the nursing notes.  Pertinent labs & imaging results that were available during my care of the patient were reviewed by me and considered in my medical decision  making (see chart for details).     MDM: 1.  Right flank pain-CT renal stone study revealed above; 2.  Hematuria, unspecified type-UA revealed above patient advised. Called and spoke with the patient via telephone to advise there is a 4 x 7 mm right upper uterteric calculus causing mild proximal obstructive uropathy.  There are 2 calculi in right kidney with largest measuring up to 4 mm also there is a single 1 mm nonobstructive callus of left kidney lower pole.  Additional bilateral nonobstructive renal calculi as described above.  Advised patient Percocet and tamsulosin  has been sent to his pharmacy.  Advised please follow-up with your PCP for further evaluation.  Patient discharged home, hemodynamically stable. Final Clinical Impressions(s) / UC Diagnoses   Final diagnoses:  Hematuria, unspecified type  Right flank pain     Discharge Instructions      Called and spoke with the patient via telephone to advise there is a 4 x 7 mm right upper uterteric calculus causing mild proximal obstructive uropathy.  There are 2 calculi in right kidney with largest measuring up to 4 mm also there is a single 1 mm nonobstructive callus of left kidney lower pole.  Additional bilateral nonobstructive renal calculi as described above.  Advised patient Percocet and tamsulosin  has been sent to his pharmacy.  Advised please follow-up with your PCP for further evaluation.     ED Prescriptions     Medication Sig Dispense Auth. Provider   oxyCODONE -acetaminophen  (PERCOCET/ROXICET) 5-325 MG tablet Take 1 tablet by mouth every 6 (six) hours as needed for severe pain (pain score 7-10). 15 tablet Despina Boan, FNP    tamsulosin  (FLOMAX ) 0.4 MG CAPS capsule Take 1 capsule (0.4 mg total) by mouth daily. 30 capsule Toren Tucholski, FNP      I have reviewed the PDMP during this encounter.     [1]  Social History Tobacco Use   Smoking status: Never    Passive exposure: Never   Smokeless tobacco: Never  Vaping Use   Vaping status: Never Used  Substance Use Topics   Alcohol use: Yes    Alcohol/week: 2.0 standard drinks of alcohol    Types: 2 Cans of beer per week    Comment: 2+ beers per week   Drug use: No     Teddy Sharper, FNP 01/23/24 1215  "

## 2024-01-23 NOTE — ED Triage Notes (Addendum)
 Pt c/o intermittent RT sided abd pain that started Fri at 2p. Somewhat subsided then returned Sunday evening all night through 3am this am. Still somewhat a dull ache. Advil prn. Hx of kidney stones.

## 2024-01-23 NOTE — Telephone Encounter (Unsigned)
 Copied from CRM [id number]. Topic: Clinical - Medical Advice >> Jan 23, 2024  1:02 PM China J wrote: Reason for CRM: The patient was seen at urgent care today for kidney stones and he's sure that all he needs is a referral from Dr. Catherine. He has an appointment for tomorrow but would like to know if it is necessary to keep it or if Dr. Catherine would be able to put the referral in.

## 2024-01-23 NOTE — Telephone Encounter (Signed)
 No further action needed at this time.

## 2024-01-24 ENCOUNTER — Ambulatory Visit: Admitting: Family Medicine

## 2024-01-24 ENCOUNTER — Encounter: Payer: Self-pay | Admitting: Family Medicine

## 2024-01-24 ENCOUNTER — Ambulatory Visit (HOSPITAL_COMMUNITY): Payer: Self-pay

## 2024-01-24 VITALS — BP 138/74 | HR 82 | Temp 98.0°F | Wt 250.8 lb

## 2024-01-24 DIAGNOSIS — R10A1 Flank pain, right side: Secondary | ICD-10-CM | POA: Diagnosis not present

## 2024-01-24 DIAGNOSIS — N200 Calculus of kidney: Secondary | ICD-10-CM | POA: Diagnosis not present

## 2024-01-24 MED ORDER — SULFAMETHOXAZOLE-TRIMETHOPRIM 800-160 MG PO TABS: 1.0000 | ORAL_TABLET | Freq: Two times a day (BID) | ORAL | 0 refills | Status: DC

## 2024-01-24 NOTE — Progress Notes (Unsigned)
 "      Stephen Dorsey , Apr 26, 1960, 64 y.o., male MRN: [id number] Patient Care Team    Relationship Specialty Notifications Start End  Catherine Charlies LABOR, DO PCP - General Family Medicine  09/23/21   Rollin Dover, MD Consulting Physician Gastroenterology  09/23/21   Raelyn Harlene DEL, OD  Optometry  09/23/21   Delores Doughty, DC  Chiropractic Medicine  09/23/21   Margaret Eduard SAUNDERS, MD Consulting Physician Neurology  09/23/21   Neysa Sensor, MD    09/23/21    Comment: Functional med  Towana Fonda RAMAN, MD Referring Physician Dermatology  09/24/21     Chief Complaint  Patient presents with   Flank Pain    Seen in UC yesterday; imaging showed kidney stones.      Subjective: Stephen Dorsey is a 64 y.o. Pt presents for an OV with complaints of right sided flank pain of 4-5 days duration.  He has a history of kidney stones requiring lithotripsy over a decade ago. Patient was seen at urgent care yesterday for abdominal pain-right lower quadrant.  He reports pain has been radiating to his right flank for 3 days at that time. He was prescribed oxycodone  for pain, Flomax .  Vitals were stable with the exception of elevated blood pressure secondary to pain. CT abdomen 01/23/2024: FINDINGS:  Adrenals/Urinary Tract: Adrenal glands are unremarkable. No suspicious renal mass within the limitations of this unenhanced exam. There is a 4 x 7 mm right upper ureteric calculus causing mild proximal obstructive uropathy. Right ureter distal to the calculus is unremarkable. There are 2 calculi in the right kidney with largest measuring up to 4 mm. There is a single 1 mm nonobstructing calculus in the left kidney lower pole calyx. No other left nephroureterolithiasis or obstructive uropathy. Urinary bladder is under distended, precluding optimal assessment. However, no large mass or stones identified. No perivesical fat stranding.  Stomach/Bowel: No disproportionate dilation of the small or large bowel loops. No evidence of  abnormal bowel wall thickening or inflammatory changes. The appendix is unremarkable. There are scattered diverticula mainly in the sigmoid colon, without imaging signs of diverticulitis.  Vascular/Lymphatic: No ascites or pneumoperitoneum. No abdominal or pelvic lymphadenopathy, by size criteria. No aneurysmal dilation of the major abdominal arteries. There are mild peripheral atherosclerotic vascular calcifications of the aorta and its major branches.  Reproductive: Top normal prostate size. Symmetric bilateral seminal vesicles. Other: There are fat containing umbilical and bilateral inguinal hernias. The soft tissues and abdominal wall are otherwise unremarkable.  Musculoskeletal: No suspicious osseous lesions. There are mild - moderate multilevel degenerative changes in the visualized spine. Bilateral L5 spondylolysis noted with associated resultant grade 1 anterolisthesis of L5 over S1.      08/08/2023    9:06 AM 02/21/2023    9:04 AM 08/05/2022    8:45 AM 09/23/2021    8:42 AM  Depression screen PHQ 2/9  Decreased Interest 0 0 0 0  Down, Depressed, Hopeless 0 0 0 0  PHQ - 2 Score 0 0 0 0  Altered sleeping 0     Tired, decreased energy 0     Change in appetite 0     Feeling bad or failure about yourself  0     Trouble concentrating 0     Moving slowly or fidgety/restless 0     Suicidal thoughts 0     PHQ-9 Score 0      Difficult doing work/chores Not difficult at all  Data saved with a previous flowsheet row definition    Allergies[1] Social History   Social History Narrative   Marital status/children/pets: Married.   Education/employment: Works for [employer].  Education 4 yr college.  B[employer].   Safety:      -smoke alarm in the home:Yes     - wears seatbelt: Yes     - Feels safe in their relationships: Yes      Past Medical History:  Diagnosis Date   Cataract 01/11/2021   Very slight imparement   Colon polyps    By colonoscopy in 2009    Decreased pedal pulses     Diabetes (HCC)    GERD (gastroesophageal reflux disease) 01/11/2021   Occasionally   HLD (hyperlipidemia)    Hypertension    Idiopathic small fiber sensory neuropathy 2008   Dx in 2008. Getting worse, has trouble feeling his feet. No Sx's in arms.   Kidney stone 1998   Low testosterone     Pneumonia due to COVID-19 virus 02/17/2019   Past Surgical History:  Procedure Laterality Date   KIDNEY STONE SURGERY     Family History  Problem Relation Age of Onset   Thyroid  disease Mother        thyroid  problems   Heart attack Mother    Hypertension Father    Hyperlipidemia Father    Lung cancer Father    Thyroid  disease Sister    Arthritis Sister    Heart attack Maternal Grandmother    Gaucher's disease Maternal Grandmother    Heart attack Maternal Grandfather    Diabetes Maternal Grandfather    Hearing loss Maternal Grandfather    Arthritis Paternal Grandmother    Hearing loss Paternal Grandmother    Heart attack Paternal Grandmother    Heart attack Paternal Grandfather    Allergies as of 01/24/2024   No Known Allergies      Medication List        Accurate as of January 24, 2024  2:20 PM. If you have any questions, ask your nurse or doctor.          amLODipine  2.5 MG tablet Commonly known as: NORVASC  Take 1 tablet (2.5 mg total) by mouth daily.   ezetimibe  10 MG tablet Commonly known as: Zetia  Take 1 tablet (10 mg total) by mouth at bedtime.   fluorouracil 5 % cream Commonly known as: EFUDEX Apply topically 2 (two) times daily.   lisinopril  40 MG tablet Commonly known as: ZESTRIL  Take 1 tablet (40 mg total) by mouth daily.   metFORMIN  500 MG 24 hr tablet Commonly known as: GLUCOPHAGE -XR Take 2 tablets (1,000 mg total) by mouth daily with supper.   oxyCODONE -acetaminophen  5-325 MG tablet Commonly known as: PERCOCET/ROXICET Take 1 tablet by mouth every 6 (six) hours as needed for severe pain (pain score 7-10).   sulfamethoxazole -trimethoprim  800-160 MG  tablet Commonly known as: BACTRIM  DS Take 1 tablet by mouth 2 (two) times daily. Started by: Charlies Bellini, DO   tadalafil  10 MG tablet Commonly known as: Cialis  Take 1 - 2 tablets as needed prior to sexual activity   tamsulosin  0.4 MG Caps capsule Commonly known as: FLOMAX  Take 1 capsule (0.4 mg total) by mouth daily.   testosterone  cypionate 200 MG/ML injection Commonly known as: DEPOTESTOSTERONE CYPIONATE Inject 0.5 mLs (100 mg total) into the muscle once a week. **single use vial per md** What changed: Another medication with the same name was removed. Continue taking this medication, and follow the directions you see here.  Changed by: Charlies Bellini, DO        All past medical history, surgical history, allergies, family history, immunizations andmedications were updated in the EMR today and reviewed under the history and medication portions of their EMR.     ROS Negative, with the exception of above mentioned in HPI   Objective:  BP 138/74   Pulse 82   Temp 98 F (36.7 C)   Wt 250 lb 12.8 oz (113.8 kg)   SpO2 97%   BMI 31.35 kg/m  Body mass index is 31.35 kg/m. Physical Exam Vitals and nursing note reviewed. Exam conducted with a chaperone present.  Constitutional:      General: He is not in acute distress.    Appearance: Normal appearance. He is not ill-appearing, toxic-appearing or diaphoretic.  HENT:     Head: Normocephalic and atraumatic.  Eyes:     General: No scleral icterus.       Right eye: No discharge.        Left eye: No discharge.     Extraocular Movements: Extraocular movements intact.     Pupils: Pupils are equal, round, and reactive to light.  Cardiovascular:     Rate and Rhythm: Normal rate and regular rhythm.  Pulmonary:     Effort: Pulmonary effort is normal. No respiratory distress.     Breath sounds: Normal breath sounds. No wheezing, rhonchi or rales.  Abdominal:     General: Abdomen is flat. Bowel sounds are normal.     Palpations:  Abdomen is soft.     Tenderness: There is no right CVA tenderness, left CVA tenderness, guarding or rebound.  Musculoskeletal:     Right lower leg: No edema.     Left lower leg: No edema.  Skin:    General: Skin is warm.     Findings: No rash.  Neurological:     Mental Status: He is alert and oriented to person, place, and time. Mental status is at baseline.  Psychiatric:        Mood and Affect: Mood normal.        Behavior: Behavior normal.        Thought Content: Thought content normal.        Judgment: Judgment normal.     No results found. No results found.  No results found for this or any previous visit (from the past 24 hours).   Assessment/Plan: Stephen Dorsey is a 64 y.o. male present for OV for  Kidney stone on right side (Primary)-7 mm x 4 mm/right flank pain - Ambulatory referral to Urology Currently he is doing okay with pain control.  Continue ibuprofen.  He has oxy on hand if needed Encouraged him to hydrate very well, continuing to flush the kidneys. Continue Flomax  daily until stone passes Cover with Bactrim  twice daily for 5 days to be safe Follow-up as needed   Reviewed expectations re: course of current medical issues. Discussed self-management of symptoms. Outlined signs and symptoms indicating need for more acute intervention. Patient verbalized understanding and all questions were answered. Patient received an After-Visit Summary.    Orders Placed This Encounter  Procedures   Ambulatory referral to Urology   Meds ordered this encounter  Medications   sulfamethoxazole -trimethoprim  (BACTRIM  DS) 800-160 MG tablet    Sig: Take 1 tablet by mouth 2 (two) times daily.    Dispense:  14 tablet    Refill:  0   Referral Orders  Ambulatory referral to Urology       Note is dictated utilizing voice recognition software. Although note has been proof read prior to signing, occasional typographical errors still can be missed. If any questions  arise, please do not hesitate to call for verification.   electronically signed by:  Charlies Bellini, DO  [facility] [place]       [1] No Known Allergies  "

## 2024-01-25 NOTE — Patient Instructions (Signed)

## 2024-01-26 ENCOUNTER — Other Ambulatory Visit: Payer: Self-pay

## 2024-01-26 ENCOUNTER — Encounter: Payer: Self-pay | Admitting: Urology

## 2024-01-26 ENCOUNTER — Ambulatory Visit: Payer: Self-pay | Admitting: Urology

## 2024-01-26 ENCOUNTER — Other Ambulatory Visit: Payer: Self-pay | Admitting: Urology

## 2024-01-26 ENCOUNTER — Ambulatory Visit (HOSPITAL_BASED_OUTPATIENT_CLINIC_OR_DEPARTMENT_OTHER)
Admission: RE | Admit: 2024-01-26 | Discharge: 2024-01-26 | Disposition: A | Discharge status: Home / Self Care | Source: Ambulatory Visit | Attending: Urology | Admitting: Urology

## 2024-01-26 ENCOUNTER — Ambulatory Visit: Admitting: Urology

## 2024-01-26 VITALS — BP 162/97 | HR 89 | Ht 75.0 in | Wt 240.0 lb

## 2024-01-26 DIAGNOSIS — N201 Calculus of ureter: Secondary | ICD-10-CM | POA: Insufficient documentation

## 2024-01-26 DIAGNOSIS — N202 Calculus of kidney with calculus of ureter: Secondary | ICD-10-CM | POA: Diagnosis not present

## 2024-01-26 DIAGNOSIS — N200 Calculus of kidney: Secondary | ICD-10-CM

## 2024-01-26 LAB — URINALYSIS, ROUTINE W REFLEX MICROSCOPIC
Bilirubin, UA: NEGATIVE
Glucose, UA: NEGATIVE
Ketones, UA: NEGATIVE
Leukocytes,UA: NEGATIVE
Nitrite, UA: NEGATIVE
Protein,UA: NEGATIVE
Urobilinogen, Ur: 0.2 mg/dL (ref 0.2–1.0)
pH, UA: 6 (ref 5.0–7.5)

## 2024-01-26 LAB — MICROSCOPIC EXAMINATION: Bacteria, UA: NONE SEEN

## 2024-01-26 NOTE — H&P (View-Only) (Signed)
 "  Assessment: 1. Ureteral calculus; right, proximal   2. Nephrolithiasis     Plan: I personally reviewed the patient's chart including provider notes, lab and imaging results. I personally reviewed the CT study from 01/23/2024 with results as noted below. KUB from today reviewed with results as noted below. Options for management of the right ureteral calculus discussed with the patient.  Specifically, I discussed spontaneous passage, shockwave lithotripsy, and ureteroscopic laser lithotripsy.  Procedure: The patient will be scheduled for right ESWL at [facility].  Surgical request is placed with the surgery schedulers and will be scheduled at the patient's/family request. Informed consent is given as documented below. Anesthesia: local  The patient does not have sleep apnea, history of MRSA, history of VRE, history of cardiac device requiring special anesthetic needs. Patient is stable and considered clear for surgical management in an outpatient ambulatory surgery setting as well as inpatient hospital setting.  Consent for Operation or Procedure: Provider Certification I hereby certify that the nature, purpose, benefits, usual and most frequent risks of, and alternatives to, the operation or procedure have been explained to the patient (or person authorized to sign for the patient) either by me as responsible physician or by the provider who is to perform the operation or procedure. Time spent such that the patient/family has had an opportunity to ask questions, and that those questions have been answered. The patient or the patient's representative has been advised that selected tasks may be performed by assistants to the primary health care provider(s). I believe that the patient (or person authorized to sign for the patient) understands what has been explained, and has consented to the operation or procedure. No guarantees were implied or made.   Chief Complaint:  Chief Complaint   Patient presents with   Nephrolithiasis    History of Present Illness:  Stephen Dorsey is a 64 y.o. male who is seen in consultation from [facility], [place] A, DO for evaluation of a right ureteral calculus. He was seen at urgent care on 01/23/2024 with right sided abdominal pain x 3 days.  Dipstick urinalysis showed small blood. CT renal stone study showed a 4 x 7 mm calculus in the right upper ureter with mild obstructive uropathy; 2 calculi in the right kidney with the largest measuring 4 mm; 1 mm nonobstructing calculus left kidney lower pole calyx. His pain has been fairly well-controlled with Advil.  He is also taking tamsulosin .  No fevers, chills, nausea, or vomiting.  No dysuria or gross hematuria. He previously was treated for a stone approximately 28 years ago.  Past Medical History:  Past Medical History:  Diagnosis Date   Cataract 01/11/2021   Very slight imparement   Colon polyps    By colonoscopy in 2009    Decreased pedal pulses    Diabetes (HCC)    GERD (gastroesophageal reflux disease) 01/11/2021   Occasionally   HLD (hyperlipidemia)    Hypertension    Idiopathic small fiber sensory neuropathy 2008   Dx in 2008. Getting worse, has trouble feeling his feet. No Sx's in arms.   Kidney stone 1998   Low testosterone     Pneumonia due to COVID-19 virus 02/17/2019    Past Surgical History:  Past Surgical History:  Procedure Laterality Date   KIDNEY STONE SURGERY      Allergies:  Allergies[1]  Family History:  Family History  Problem Relation Age of Onset   Thyroid  disease Mother        thyroid  problems  Heart attack Mother    Hypertension Father    Hyperlipidemia Father    Lung cancer Father    Thyroid  disease Sister    Arthritis Sister    Heart attack Maternal Grandmother    Gaucher's disease Maternal Grandmother    Heart attack Maternal Grandfather    Diabetes Maternal Grandfather    Hearing loss Maternal Grandfather    Arthritis Paternal Grandmother     Hearing loss Paternal Grandmother    Heart attack Paternal Grandmother    Heart attack Paternal Grandfather     Social History:  Social History[2]  Review of symptoms:  Constitutional:  Negative for unexplained weight loss, night sweats, fever, chills ENT:  Negative for nose bleeds, sinus pain, painful swallowing CV:  Negative for chest pain, shortness of breath, exercise intolerance, palpitations, loss of consciousness Resp:  Negative for cough, wheezing, shortness of breath GI:  Negative for nausea, vomiting, diarrhea, bloody stools GU:  Positives noted in HPI; otherwise negative for gross hematuria, dysuria, urinary incontinence Neuro:  Negative for seizures, poor balance, limb weakness, slurred speech Psych:  Negative for lack of energy, depression, anxiety Endocrine:  Negative for polydipsia, polyuria, symptoms of hypoglycemia (dizziness, hunger, sweating) Hematologic:  Negative for anemia, purpura, petechia, prolonged or excessive bleeding, use of anticoagulants  Allergic:  Negative for difficulty breathing or choking as a result of exposure to anything; no shellfish allergy; no allergic response (rash/itch) to materials, foods  Physical exam: BP (!) 162/97   Pulse 89   Ht 6' 3 (1.905 m)   Wt 240 lb (108.9 kg)   BMI 30.00 kg/m  GENERAL APPEARANCE:  Well appearing, well developed, well nourished, NAD HEENT: Atraumatic, Normocephalic, oropharynx clear. NECK: Supple without lymphadenopathy or thyromegaly. LUNGS: Clear to auscultation bilaterally. HEART: Regular Rate and Rhythm without murmurs, gallops, or rubs. ABDOMEN: Soft, non-tender, No Masses. EXTREMITIES: Moves all extremities well.  Without clubbing, cyanosis, or edema. NEUROLOGIC:  Alert and oriented x 3, normal gait, CN II-XII grossly intact.  MENTAL STATUS:  Appropriate. BACK:  Non-tender to palpation.  No CVAT SKIN:  Warm, dry and intact.    Results: U/A: 0-5 WBCs, 0-2 RBCs  KUB: 6 x 4 mm calcification seen  to the right of L3 transverse process consistent with proximal ureteral calculus; calcification seen in the right renal shadow.     [1] No Known Allergies [2]  Social History Tobacco Use   Smoking status: Never    Passive exposure: Never   Smokeless tobacco: Never  Vaping Use   Vaping status: Never Used  Substance Use Topics   Alcohol use: Yes    Alcohol/week: 2.0 standard drinks of alcohol    Types: 2 Cans of beer per week    Comment: 2+ beers per week   Drug use: No   "

## 2024-01-26 NOTE — Progress Notes (Signed)
 "  Assessment: 1. Ureteral calculus; right, proximal   2. Nephrolithiasis     Plan: I personally reviewed the patient's chart including provider notes, lab and imaging results. I personally reviewed the CT study from 01/23/2024 with results as noted below. KUB from today reviewed with results as noted below. Options for management of the right ureteral calculus discussed with the patient.  Specifically, I discussed spontaneous passage, shockwave lithotripsy, and ureteroscopic laser lithotripsy.  Procedure: The patient will be scheduled for right ESWL at [facility].  Surgical request is placed with the surgery schedulers and will be scheduled at the patient's/family request. Informed consent is given as documented below. Anesthesia: local  The patient does not have sleep apnea, history of MRSA, history of VRE, history of cardiac device requiring special anesthetic needs. Patient is stable and considered clear for surgical management in an outpatient ambulatory surgery setting as well as inpatient hospital setting.  Consent for Operation or Procedure: Provider Certification I hereby certify that the nature, purpose, benefits, usual and most frequent risks of, and alternatives to, the operation or procedure have been explained to the patient (or person authorized to sign for the patient) either by me as responsible physician or by the provider who is to perform the operation or procedure. Time spent such that the patient/family has had an opportunity to ask questions, and that those questions have been answered. The patient or the patient's representative has been advised that selected tasks may be performed by assistants to the primary health care provider(s). I believe that the patient (or person authorized to sign for the patient) understands what has been explained, and has consented to the operation or procedure. No guarantees were implied or made.   Chief Complaint:  Chief Complaint   Patient presents with   Nephrolithiasis    History of Present Illness:  ABIJAH ROUSSEL is a 64 y.o. male who is seen in consultation from [facility], [place] A, DO for evaluation of a right ureteral calculus. He was seen at urgent care on 01/23/2024 with right sided abdominal pain x 3 days.  Dipstick urinalysis showed small blood. CT renal stone study showed a 4 x 7 mm calculus in the right upper ureter with mild obstructive uropathy; 2 calculi in the right kidney with the largest measuring 4 mm; 1 mm nonobstructing calculus left kidney lower pole calyx. His pain has been fairly well-controlled with Advil.  He is also taking tamsulosin .  No fevers, chills, nausea, or vomiting.  No dysuria or gross hematuria. He previously was treated for a stone approximately 28 years ago.  Past Medical History:  Past Medical History:  Diagnosis Date   Cataract 01/11/2021   Very slight imparement   Colon polyps    By colonoscopy in 2009    Decreased pedal pulses    Diabetes (HCC)    GERD (gastroesophageal reflux disease) 01/11/2021   Occasionally   HLD (hyperlipidemia)    Hypertension    Idiopathic small fiber sensory neuropathy 2008   Dx in 2008. Getting worse, has trouble feeling his feet. No Sx's in arms.   Kidney stone 1998   Low testosterone     Pneumonia due to COVID-19 virus 02/17/2019    Past Surgical History:  Past Surgical History:  Procedure Laterality Date   KIDNEY STONE SURGERY      Allergies:  Allergies[1]  Family History:  Family History  Problem Relation Age of Onset   Thyroid  disease Mother        thyroid  problems  Heart attack Mother    Hypertension Father    Hyperlipidemia Father    Lung cancer Father    Thyroid  disease Sister    Arthritis Sister    Heart attack Maternal Grandmother    Gaucher's disease Maternal Grandmother    Heart attack Maternal Grandfather    Diabetes Maternal Grandfather    Hearing loss Maternal Grandfather    Arthritis Paternal Grandmother     Hearing loss Paternal Grandmother    Heart attack Paternal Grandmother    Heart attack Paternal Grandfather     Social History:  Social History[2]  Review of symptoms:  Constitutional:  Negative for unexplained weight loss, night sweats, fever, chills ENT:  Negative for nose bleeds, sinus pain, painful swallowing CV:  Negative for chest pain, shortness of breath, exercise intolerance, palpitations, loss of consciousness Resp:  Negative for cough, wheezing, shortness of breath GI:  Negative for nausea, vomiting, diarrhea, bloody stools GU:  Positives noted in HPI; otherwise negative for gross hematuria, dysuria, urinary incontinence Neuro:  Negative for seizures, poor balance, limb weakness, slurred speech Psych:  Negative for lack of energy, depression, anxiety Endocrine:  Negative for polydipsia, polyuria, symptoms of hypoglycemia (dizziness, hunger, sweating) Hematologic:  Negative for anemia, purpura, petechia, prolonged or excessive bleeding, use of anticoagulants  Allergic:  Negative for difficulty breathing or choking as a result of exposure to anything; no shellfish allergy; no allergic response (rash/itch) to materials, foods  Physical exam: BP (!) 162/97   Pulse 89   Ht 6' 3 (1.905 m)   Wt 240 lb (108.9 kg)   BMI 30.00 kg/m  GENERAL APPEARANCE:  Well appearing, well developed, well nourished, NAD HEENT: Atraumatic, Normocephalic, oropharynx clear. NECK: Supple without lymphadenopathy or thyromegaly. LUNGS: Clear to auscultation bilaterally. HEART: Regular Rate and Rhythm without murmurs, gallops, or rubs. ABDOMEN: Soft, non-tender, No Masses. EXTREMITIES: Moves all extremities well.  Without clubbing, cyanosis, or edema. NEUROLOGIC:  Alert and oriented x 3, normal gait, CN II-XII grossly intact.  MENTAL STATUS:  Appropriate. BACK:  Non-tender to palpation.  No CVAT SKIN:  Warm, dry and intact.    Results: U/A: 0-5 WBCs, 0-2 RBCs  KUB: 6 x 4 mm calcification seen  to the right of L3 transverse process consistent with proximal ureteral calculus; calcification seen in the right renal shadow.     [1] No Known Allergies [2]  Social History Tobacco Use   Smoking status: Never    Passive exposure: Never   Smokeless tobacco: Never  Vaping Use   Vaping status: Never Used  Substance Use Topics   Alcohol use: Yes    Alcohol/week: 2.0 standard drinks of alcohol    Types: 2 Cans of beer per week    Comment: 2+ beers per week   Drug use: No   "

## 2024-01-27 ENCOUNTER — Encounter (HOSPITAL_COMMUNITY): Payer: Self-pay | Admitting: Urology

## 2024-01-27 NOTE — Progress Notes (Signed)
 LITHO PREOP PHONE CALL   ALLERGIES REVIEWED: YES  MEDICATION REVIEW DONE: YES MEDICATIONS THAT PT SHOULD HOLD (LIST): ASA 72hr, NSAIDS 48hr  CAN PT WALK UP STAIRS WITHOUT SHORTNESS OF BREATH: YES/NO HOME O2: NO CPAP: NO  IF YES, INFORMED PT TO BRING CPAP WITH TUBING AND MASK:YES/NO   INFORMED DRIVER NEEDED FOR PROCEDURE: YES   PT WAS GIVEN BLUE FOLDER AT UROLOGY APPT: YES PT INFORMED TO BRING BLUE FOLDER WITH ALL CONTENTS: YES  REVIEWED ARRIVAL TIME AND LOCATION: YES  OTHER PERTINENT INFORMATION:

## 2024-01-30 ENCOUNTER — Ambulatory Visit (HOSPITAL_COMMUNITY)

## 2024-01-30 ENCOUNTER — Encounter (HOSPITAL_COMMUNITY)
Admission: RE | Disposition: A | Discharge status: Home / Self Care | Payer: Self-pay | Source: Home / Self Care | Attending: Urology

## 2024-01-30 ENCOUNTER — Other Ambulatory Visit: Payer: Self-pay

## 2024-01-30 ENCOUNTER — Ambulatory Visit (HOSPITAL_COMMUNITY)
Admission: RE | Admit: 2024-01-30 | Discharge: 2024-01-30 | Disposition: A | Discharge status: Home / Self Care | Attending: Urology | Admitting: Urology

## 2024-01-30 ENCOUNTER — Other Ambulatory Visit (HOSPITAL_COMMUNITY): Payer: Self-pay

## 2024-01-30 ENCOUNTER — Encounter (HOSPITAL_COMMUNITY): Payer: Self-pay | Admitting: Urology

## 2024-01-30 DIAGNOSIS — N202 Calculus of kidney with calculus of ureter: Secondary | ICD-10-CM | POA: Diagnosis present

## 2024-01-30 DIAGNOSIS — N201 Calculus of ureter: Secondary | ICD-10-CM

## 2024-01-30 DIAGNOSIS — I10 Essential (primary) hypertension: Secondary | ICD-10-CM | POA: Diagnosis not present

## 2024-01-30 DIAGNOSIS — E119 Type 2 diabetes mellitus without complications: Secondary | ICD-10-CM | POA: Insufficient documentation

## 2024-01-30 HISTORY — PX: EXTRACORPOREAL SHOCK WAVE LITHOTRIPSY: SHX1557

## 2024-01-30 LAB — GLUCOSE, CAPILLARY: Glucose-Capillary: 112 mg/dL — ABNORMAL HIGH (ref 70–99)

## 2024-01-30 MED ORDER — DIPHENHYDRAMINE HCL 25 MG PO CAPS
25.0000 mg | ORAL_CAPSULE | ORAL | Status: AC
  Administered 2024-01-30: 25 mg via ORAL
  Filled 2024-01-30: qty 1

## 2024-01-30 MED ORDER — CIPROFLOXACIN HCL 500 MG PO TABS
500.0000 mg | ORAL_TABLET | ORAL | Status: AC
  Administered 2024-01-30: 500 mg via ORAL
  Filled 2024-01-30: qty 1

## 2024-01-30 NOTE — Interval H&P Note (Signed)
 History and Physical Interval Note:  01/30/2024 12:56 PM  Stephen Dorsey  has presented today for surgery, with the diagnosis of right ureteral stone.  The various methods of treatment have been discussed with the patient and family. After consideration of risks, benefits and other options for treatment, the patient has consented to  Procedures: LITHOTRIPSY, ESWL (Right) as a surgical intervention.  The patient's history has been reviewed, patient examined, no change in status, stable for surgery.  I have reviewed the patient's chart and labs.  Questions were answered to the patient's satisfaction.     Adine Manly

## 2024-01-30 NOTE — Op Note (Signed)
 Operative Note  Procedure:  right shock wave lithotripsy  Please see Operative Note from [facility] scanned into chart.  Stephen DOROTHA Manly, MD [facility]rology [facility] [phone]

## 2024-01-30 NOTE — Discharge Instructions (Signed)
 Stephen Dorsey

## 2024-01-31 ENCOUNTER — Ambulatory Visit: Admitting: Urology

## 2024-01-31 ENCOUNTER — Encounter (HOSPITAL_COMMUNITY): Payer: Self-pay | Admitting: Urology

## 2024-02-07 ENCOUNTER — Other Ambulatory Visit: Payer: Self-pay | Admitting: Family Medicine

## 2024-02-13 ENCOUNTER — Other Ambulatory Visit (HOSPITAL_COMMUNITY): Payer: Self-pay

## 2024-02-13 MED ORDER — CIALIS 10 MG PO TABS
10.0000 mg | ORAL_TABLET | ORAL | 11 refills | Status: AC | PRN
  Filled 2024-02-13: qty 30, 15d supply, fill #0

## 2024-02-13 MED ORDER — TADALAFIL 10 MG PO TABS
10.0000 mg | ORAL_TABLET | Freq: Every day | ORAL | 11 refills | Status: AC | PRN
  Filled 2024-02-13: qty 30, 15d supply, fill #0

## 2024-02-14 ENCOUNTER — Other Ambulatory Visit: Payer: Self-pay

## 2024-02-14 ENCOUNTER — Other Ambulatory Visit (HOSPITAL_COMMUNITY): Payer: Self-pay

## 2024-02-15 ENCOUNTER — Ambulatory Visit (HOSPITAL_BASED_OUTPATIENT_CLINIC_OR_DEPARTMENT_OTHER)
Admission: RE | Admit: 2024-02-15 | Discharge: 2024-02-15 | Disposition: A | Source: Ambulatory Visit | Attending: Urology | Admitting: Urology

## 2024-02-15 ENCOUNTER — Ambulatory Visit: Admitting: Urology

## 2024-02-15 ENCOUNTER — Encounter: Payer: Self-pay | Admitting: Urology

## 2024-02-15 VITALS — BP 148/90 | HR 81 | Ht 75.0 in | Wt 235.0 lb

## 2024-02-15 DIAGNOSIS — N201 Calculus of ureter: Secondary | ICD-10-CM

## 2024-02-15 DIAGNOSIS — N200 Calculus of kidney: Secondary | ICD-10-CM

## 2024-02-15 LAB — URINALYSIS, ROUTINE W REFLEX MICROSCOPIC
Bilirubin, UA: NEGATIVE
Glucose, UA: NEGATIVE
Ketones, UA: NEGATIVE
Leukocytes,UA: NEGATIVE
Nitrite, UA: NEGATIVE
Protein,UA: NEGATIVE
RBC, UA: NEGATIVE
Specific Gravity, UA: 1.01 (ref 1.005–1.030)
Urobilinogen, Ur: 0.2 mg/dL (ref 0.2–1.0)
pH, UA: 6.5 (ref 5.0–7.5)

## 2024-02-15 NOTE — Progress Notes (Signed)
 "  Assessment: 1. Ureteral calculus   2. Nephrolithiasis     Plan: KUB from today reviewed with results as noted below. Stone sent for [employer] prevention information provided Renal U/S in 1 month - will call with results  Chief Complaint:  Chief Complaint  Patient presents with   Nephrolithiasis    History of Present Illness:  Stephen Dorsey is a 64 y.o. male who is seen for continued evaluation of a right ureteral calculus. He was seen at urgent care on 01/23/2024 with right sided abdominal pain x 3 days.  Dipstick urinalysis showed small blood. CT renal stone study showed a 4 x 7 mm calculus in the right upper ureter with mild obstructive uropathy; 2 calculi in the right kidney with the largest measuring 4 mm; 1 mm nonobstructing calculus left kidney lower pole calyx. KUB from 01/26/24 showed a 6 x 4 mm calcification to the right of L3 transverse process consistent with proximal ureteral calculus; calcification in the right renal shadow.  He underwent right ESWL on 01/30/2024. He has done well since the procedure.  He passed a number of small stone fragments.  No difficulty passing the fragments.  He continues on tamsulosin .  No dysuria or gross hematuria.  No flank pain.  Portions of the above documentation were copied from a prior visit for review purposes only.   Past Medical History:  Past Medical History:  Diagnosis Date   Cataract 01/11/2021   Very slight imparement   Colon polyps    By colonoscopy in 2009    Decreased pedal pulses    Diabetes (HCC)    GERD (gastroesophageal reflux disease) 01/11/2021   Occasionally   HLD (hyperlipidemia)    Hypertension    Idiopathic small fiber sensory neuropathy 2008   Dx in 2008. Getting worse, has trouble feeling his feet. No Sx's in arms.   Kidney stone 1998   Low testosterone     Pneumonia due to COVID-19 virus 02/17/2019    Past Surgical History:  Past Surgical History:  Procedure Laterality Date   EXTRACORPOREAL  SHOCK WAVE LITHOTRIPSY Right 01/30/2024   Procedure: LITHOTRIPSY, ESWL;  Surgeon: Roseann Adine JINNY., MD;  Location: [facility];  Service: Urology;  Laterality: Right;   KIDNEY STONE SURGERY      Allergies:  Allergies[1]  Family History:  Family History  Problem Relation Age of Onset   Thyroid  disease Mother        thyroid  problems   Heart attack Mother    Hypertension Father    Hyperlipidemia Father    Lung cancer Father    Thyroid  disease Sister    Arthritis Sister    Heart attack Maternal Grandmother    Gaucher's disease Maternal Grandmother    Heart attack Maternal Grandfather    Diabetes Maternal Grandfather    Hearing loss Maternal Grandfather    Arthritis Paternal Grandmother    Hearing loss Paternal Grandmother    Heart attack Paternal Grandmother    Heart attack Paternal Grandfather     Social History:  Social History[2]  ROS: Constitutional:  Negative for fever, chills, weight loss CV: Negative for chest pain, previous MI, hypertension Respiratory:  Negative for shortness of breath, wheezing, sleep apnea, frequent cough GI:  Negative for nausea, vomiting, bloody stool, GERD  Physical exam: BP (!) 148/90   Pulse 81   Ht 6' 3 (1.905 m)   Wt 235 lb (106.6 kg)   BMI 29.37 kg/m  GENERAL APPEARANCE:  Well appearing, well developed, well  nourished, NAD HEENT:  Atraumatic, normocephalic, oropharynx clear NECK:  Supple without lymphadenopathy or thyromegaly ABDOMEN:  Soft, non-tender, no masses EXTREMITIES:  Moves all extremities well, without clubbing, cyanosis, or edema NEUROLOGIC:  Alert and oriented x 3, normal gait, CN II-XII grossly intact MENTAL STATUS:  appropriate BACK:  Non-tender to palpation, No CVAT SKIN:  Warm, dry, and intact  Results: U/A: Negative  KUB: No obvious calcification seen along the expected course of the right ureter.  Small calcification seen in the right renal shadow.     [1] No Known Allergies [2]  Social History Tobacco  Use   Smoking status: Never    Passive exposure: Never   Smokeless tobacco: Never  Vaping Use   Vaping status: Never Used  Substance Use Topics   Alcohol use: Yes    Alcohol/week: 2.0 standard drinks of alcohol    Types: 2 Cans of beer per week    Comment: 2+ beers per week   Drug use: No   "

## 2024-02-15 NOTE — Addendum Note (Signed)
 Addended by: OBADIAH ROSELEE RAMAN on: 02/15/2024 11:09 AM   Modules accepted: Orders

## 2024-03-05 ENCOUNTER — Encounter: Admitting: Family Medicine

## 2024-03-28 ENCOUNTER — Other Ambulatory Visit (HOSPITAL_BASED_OUTPATIENT_CLINIC_OR_DEPARTMENT_OTHER)
